# Patient Record
Sex: Female | Born: 1980 | Race: White | Hispanic: Yes | Marital: Married | Smoking: Never smoker
Health system: Southern US, Community
[De-identification: ages and names within clinical notes are randomized; demographics above are authoritative.]

## PROBLEM LIST (undated history)

## (undated) ENCOUNTER — Ambulatory Visit (HOSPITAL_COMMUNITY): Admission: EM | Payer: Self-pay | Source: Home / Self Care

## (undated) ENCOUNTER — Inpatient Hospital Stay (HOSPITAL_COMMUNITY): Payer: Self-pay

## (undated) DIAGNOSIS — Z789 Other specified health status: Secondary | ICD-10-CM

## (undated) HISTORY — PX: NO PAST SURGERIES: SHX2092

---

## 2000-08-04 ENCOUNTER — Ambulatory Visit (HOSPITAL_COMMUNITY): Admission: RE | Admit: 2000-08-04 | Discharge: 2000-08-04 | Payer: Self-pay | Admitting: *Deleted

## 2000-12-24 ENCOUNTER — Observation Stay (HOSPITAL_COMMUNITY): Admission: AD | Admit: 2000-12-24 | Discharge: 2000-12-25 | Payer: Self-pay | Admitting: *Deleted

## 2000-12-29 ENCOUNTER — Inpatient Hospital Stay (HOSPITAL_COMMUNITY): Admission: AD | Admit: 2000-12-29 | Discharge: 2000-12-29 | Payer: Self-pay | Admitting: Obstetrics & Gynecology

## 2001-01-07 ENCOUNTER — Inpatient Hospital Stay (HOSPITAL_COMMUNITY): Admission: AD | Admit: 2001-01-07 | Discharge: 2001-01-09 | Payer: Self-pay | Admitting: *Deleted

## 2002-02-13 ENCOUNTER — Ambulatory Visit (HOSPITAL_COMMUNITY): Admission: RE | Admit: 2002-02-13 | Discharge: 2002-02-13 | Payer: Self-pay | Admitting: *Deleted

## 2002-05-09 ENCOUNTER — Ambulatory Visit (HOSPITAL_COMMUNITY): Admission: RE | Admit: 2002-05-09 | Discharge: 2002-05-09 | Payer: Self-pay | Admitting: *Deleted

## 2002-05-21 ENCOUNTER — Observation Stay (HOSPITAL_COMMUNITY): Admission: AD | Admit: 2002-05-21 | Discharge: 2002-05-21 | Payer: Self-pay | Admitting: *Deleted

## 2002-05-28 ENCOUNTER — Inpatient Hospital Stay (HOSPITAL_COMMUNITY): Admission: AD | Admit: 2002-05-28 | Discharge: 2002-05-28 | Payer: Self-pay | Admitting: Obstetrics and Gynecology

## 2002-05-30 ENCOUNTER — Inpatient Hospital Stay (HOSPITAL_COMMUNITY): Admission: AD | Admit: 2002-05-30 | Discharge: 2002-05-30 | Payer: Self-pay | Admitting: Obstetrics and Gynecology

## 2002-06-01 ENCOUNTER — Inpatient Hospital Stay (HOSPITAL_COMMUNITY): Admission: AD | Admit: 2002-06-01 | Discharge: 2002-06-01 | Payer: Self-pay | Admitting: Family Medicine

## 2002-06-15 ENCOUNTER — Encounter: Payer: Self-pay | Admitting: Family Medicine

## 2002-06-15 ENCOUNTER — Inpatient Hospital Stay (HOSPITAL_COMMUNITY): Admission: AD | Admit: 2002-06-15 | Discharge: 2002-06-15 | Payer: Self-pay | Admitting: Family Medicine

## 2002-06-23 ENCOUNTER — Inpatient Hospital Stay (HOSPITAL_COMMUNITY): Admission: AD | Admit: 2002-06-23 | Discharge: 2002-06-25 | Payer: Self-pay | Admitting: Family Medicine

## 2004-08-19 ENCOUNTER — Emergency Department (HOSPITAL_COMMUNITY): Admission: EM | Admit: 2004-08-19 | Discharge: 2004-08-19 | Payer: Self-pay | Admitting: Emergency Medicine

## 2006-07-10 ENCOUNTER — Emergency Department (HOSPITAL_COMMUNITY): Admission: EM | Admit: 2006-07-10 | Discharge: 2006-07-10 | Payer: Self-pay | Admitting: Emergency Medicine

## 2006-08-11 ENCOUNTER — Emergency Department (HOSPITAL_COMMUNITY): Admission: EM | Admit: 2006-08-11 | Discharge: 2006-08-12 | Payer: Self-pay | Admitting: Emergency Medicine

## 2006-09-09 ENCOUNTER — Ambulatory Visit (HOSPITAL_COMMUNITY): Admission: RE | Admit: 2006-09-09 | Discharge: 2006-09-09 | Payer: Self-pay | Admitting: Family Medicine

## 2007-01-19 ENCOUNTER — Inpatient Hospital Stay (HOSPITAL_COMMUNITY): Admission: AD | Admit: 2007-01-19 | Discharge: 2007-01-20 | Payer: Self-pay | Admitting: Gynecology

## 2007-01-19 ENCOUNTER — Ambulatory Visit: Payer: Self-pay | Admitting: Obstetrics and Gynecology

## 2007-01-26 ENCOUNTER — Inpatient Hospital Stay (HOSPITAL_COMMUNITY): Admission: AD | Admit: 2007-01-26 | Discharge: 2007-01-28 | Payer: Self-pay | Admitting: Obstetrics & Gynecology

## 2007-01-26 ENCOUNTER — Ambulatory Visit: Payer: Self-pay | Admitting: Obstetrics and Gynecology

## 2009-06-15 ENCOUNTER — Emergency Department (HOSPITAL_COMMUNITY): Admission: EM | Admit: 2009-06-15 | Discharge: 2009-06-15 | Payer: Self-pay | Admitting: Internal Medicine

## 2010-05-05 LAB — COMPREHENSIVE METABOLIC PANEL
BUN: 9 mg/dL (ref 6–23)
CO2: 24 mEq/L (ref 19–32)
Chloride: 107 mEq/L (ref 96–112)
Creatinine, Ser: 0.52 mg/dL (ref 0.4–1.2)
GFR calc non Af Amer: >60 mL/min (ref >60)
Glucose, Bld: 107 mg/dL — ABNORMAL HIGH (ref 70–99)
Total Bilirubin: 0.8 mg/dL (ref 0.3–1.2)

## 2010-05-05 LAB — DIFFERENTIAL
Basophils Absolute: 0 10*3/uL (ref 0.0–0.1)
Basophils Relative: 0 % (ref 0–1)
Lymphocytes Relative: 9 % — ABNORMAL LOW (ref 12–46)
Neutro Abs: 6.7 10*3/uL (ref 1.7–7.7)
Neutrophils Relative %: 82 % — ABNORMAL HIGH (ref 43–77)

## 2010-05-05 LAB — URINALYSIS, ROUTINE W REFLEX MICROSCOPIC
Ketones, ur: 15 mg/dL — AB
Leukocytes, UA: NEGATIVE
Nitrite: NEGATIVE
Specific Gravity, Urine: 1.034 — ABNORMAL HIGH (ref 1.005–1.030)
Urobilinogen, UA: 0.2 mg/dL (ref 0.0–1.0)
pH: 6 (ref 5.0–8.0)

## 2010-05-05 LAB — CBC
HCT: 43.2 % (ref 36.0–46.0)
Hemoglobin: 14.9 g/dL (ref 12.0–15.0)
MCV: 87.9 fL (ref 78.0–100.0)
Platelets: 201 10*3/uL (ref 150–400)
RBC: 4.92 MIL/uL (ref 3.87–5.11)
WBC: 8.2 10*3/uL (ref 4.0–10.5)

## 2010-05-05 LAB — URINE MICROSCOPIC-ADD ON

## 2010-11-23 LAB — SYPHILIS: RPR W/REFLEX TO RPR TITER AND TREPONEMAL ANTIBODIES, TRADITIONAL SCREENING AND DIAGNOSIS ALGORITHM: RPR Ser Ql: NONREACTIVE

## 2010-11-23 LAB — CBC
HCT: 31.6 — ABNORMAL LOW
HCT: 35.7 — ABNORMAL LOW
Hemoglobin: 10.5 — ABNORMAL LOW
Hemoglobin: 12.1
MCHC: 33.3
MCV: 84.5
MCV: 85.7
Platelets: 193
Platelets: 230
RBC: 3.69 — ABNORMAL LOW
RDW: 14.3
WBC: 12.2 — ABNORMAL HIGH
WBC: 9.9

## 2010-12-02 LAB — POCT I-STAT CREATININE
Creatinine, Ser: 0.6
Operator id: 284251

## 2010-12-02 LAB — URINALYSIS, ROUTINE W REFLEX MICROSCOPIC
Bilirubin Urine: NEGATIVE
Glucose, UA: NEGATIVE
Ketones, ur: >80 — AB
Nitrite: NEGATIVE
Specific Gravity, Urine: 1.02
pH: 6.5

## 2010-12-02 LAB — I-STAT 8, (EC8 V) (CONVERTED LAB)
Acid-base deficit: 4 — ABNORMAL HIGH
Bicarbonate: 18.6 — ABNORMAL LOW
Glucose, Bld: 86
Potassium: 3.3 — ABNORMAL LOW
TCO2: 19
pCO2, Ven: 26.9 — ABNORMAL LOW
pH, Ven: 7.447 — ABNORMAL HIGH

## 2010-12-02 LAB — DIFFERENTIAL
Basophils Absolute: 0
Basophils Relative: 0
Eosinophils Absolute: 0
Monocytes Relative: 17 — ABNORMAL HIGH
Neutro Abs: 2.5
Neutrophils Relative %: 62

## 2010-12-02 LAB — URINE MICROSCOPIC-ADD ON

## 2010-12-02 LAB — CBC
MCHC: 34.2
MCV: 86.6
Platelets: 190
RBC: 4.06
RDW: 13.3

## 2010-12-02 LAB — HCG, QUANTITATIVE, PREGNANCY: hCG, Beta Chain, Quant, S: 21312 — ABNORMAL HIGH

## 2011-07-09 IMAGING — US US ABDOMEN COMPLETE
1 series · 14 of 25 positions shown · non-contrast
Comparison: None

CLINICAL DATA: Abdominal pain, nausea and vomiting.

COMPLETE ABDOMINAL ULTRASOUND

[Series 1: us abdomen complete · 0.26mm/px · 14 of 67 slices shown]
[im 1/67]
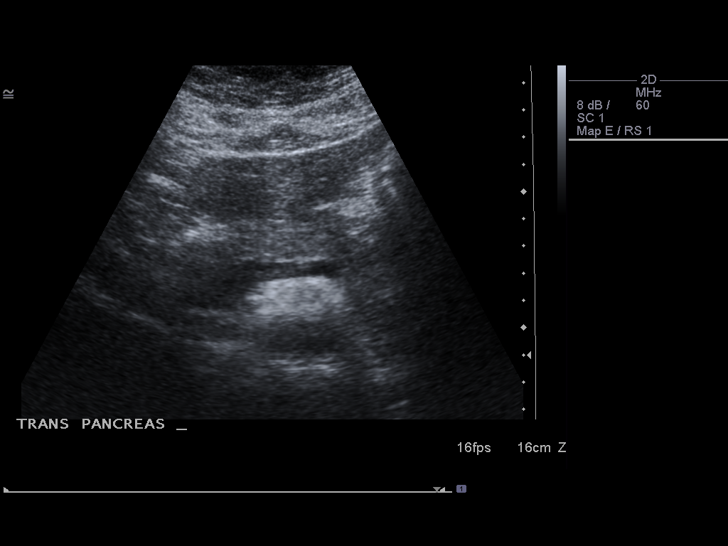
[im 6/67]
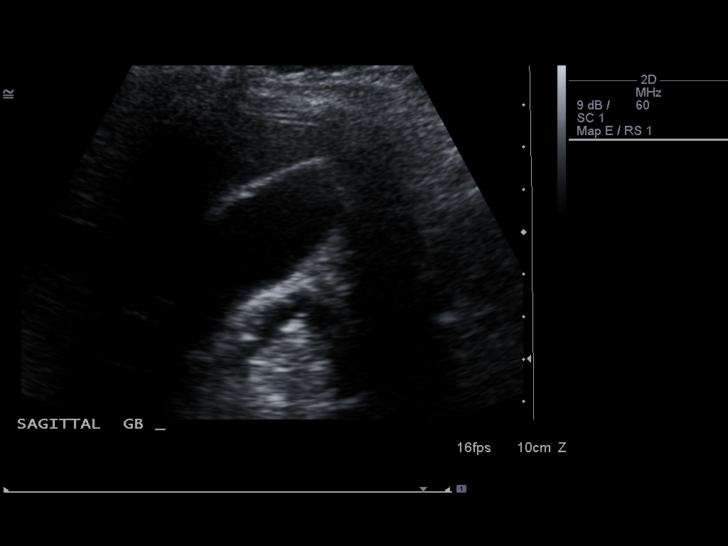
[im 12/67]
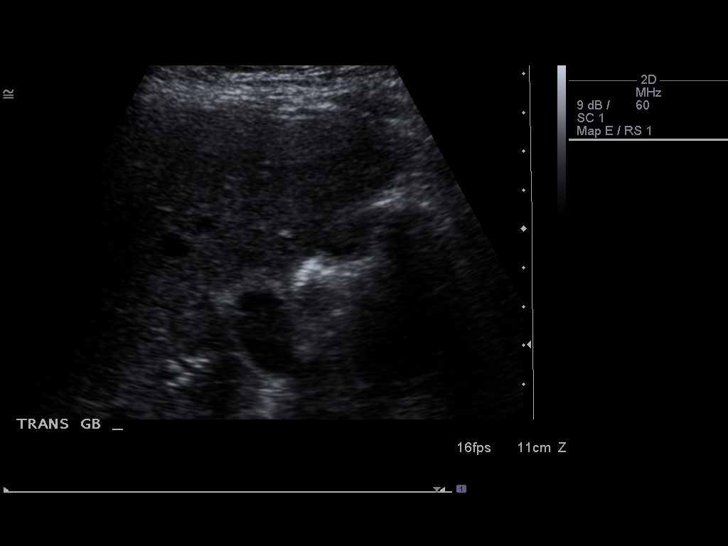
[im 17/67]
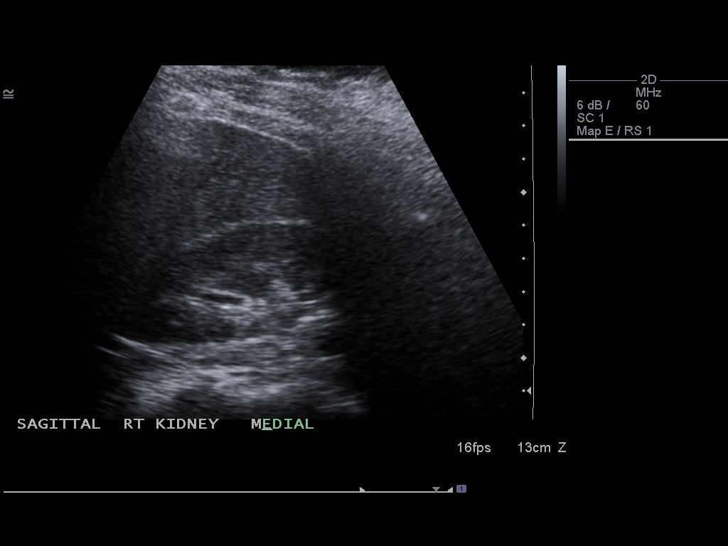
[im 23/67]
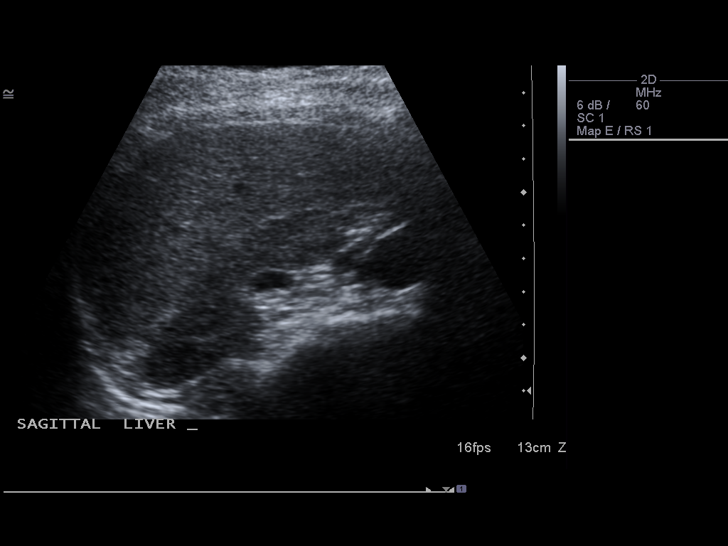
[im 25/67]
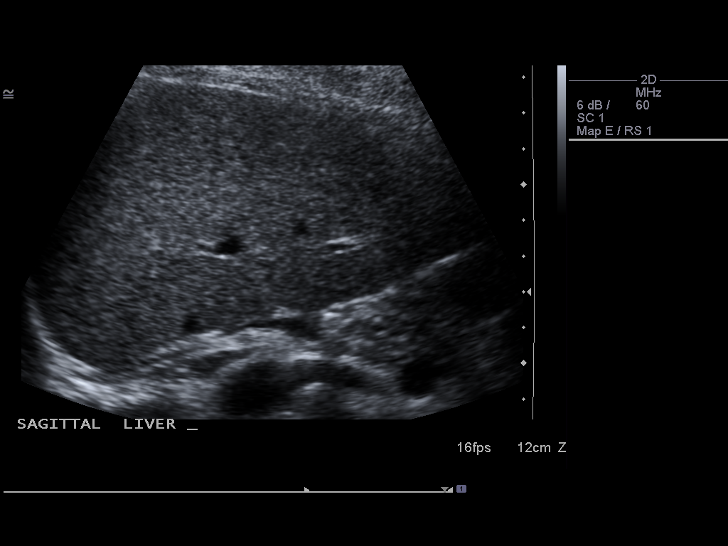
[im 31/67]
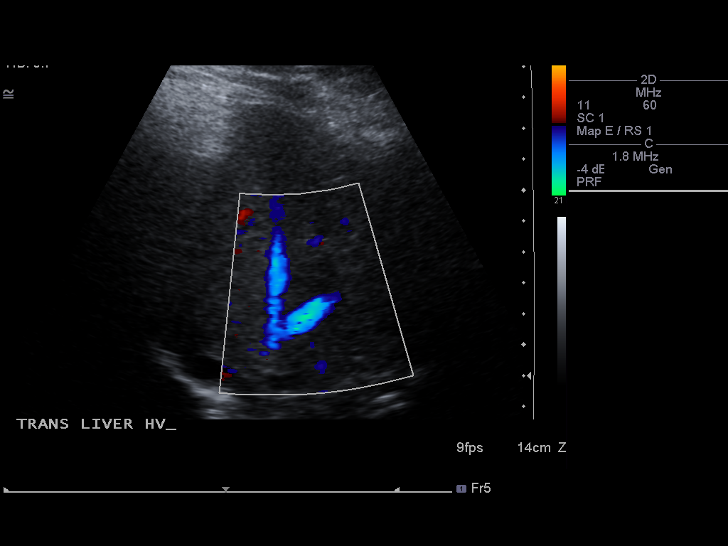
[im 36/67]
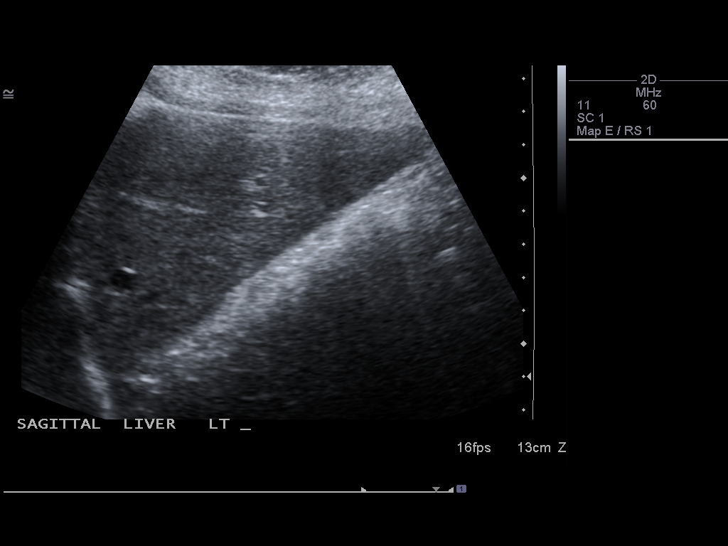
[im 42/67]
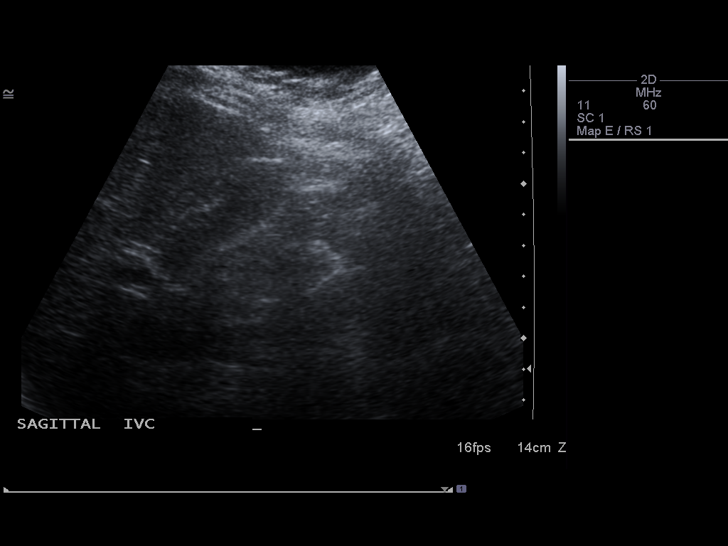
[im 45/67]
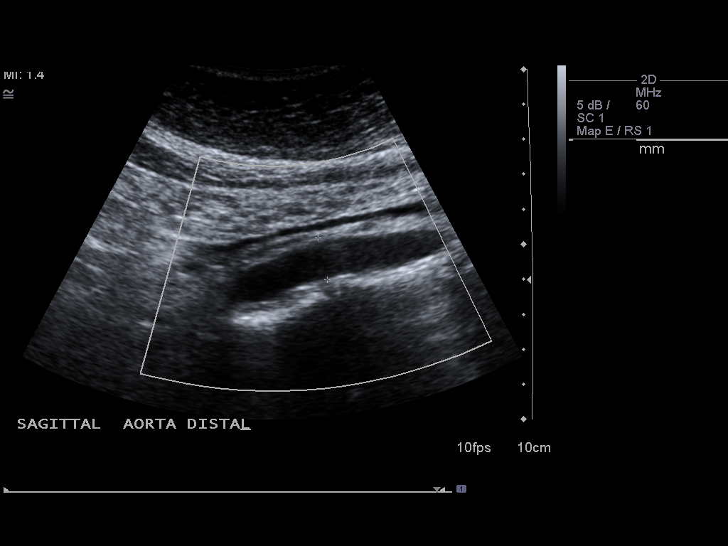
[im 50/67]
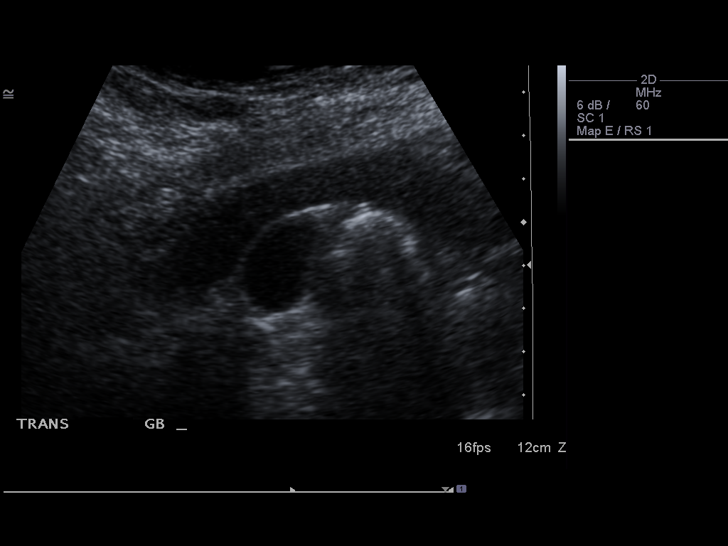
[im 56/67]
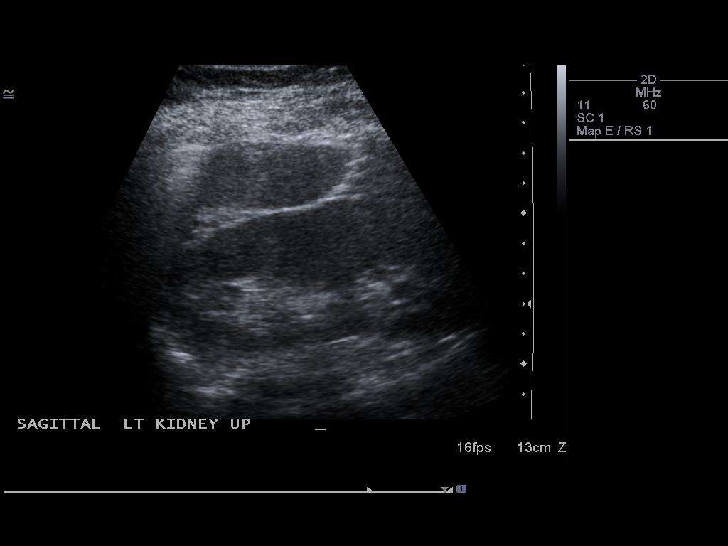
[im 61/67]
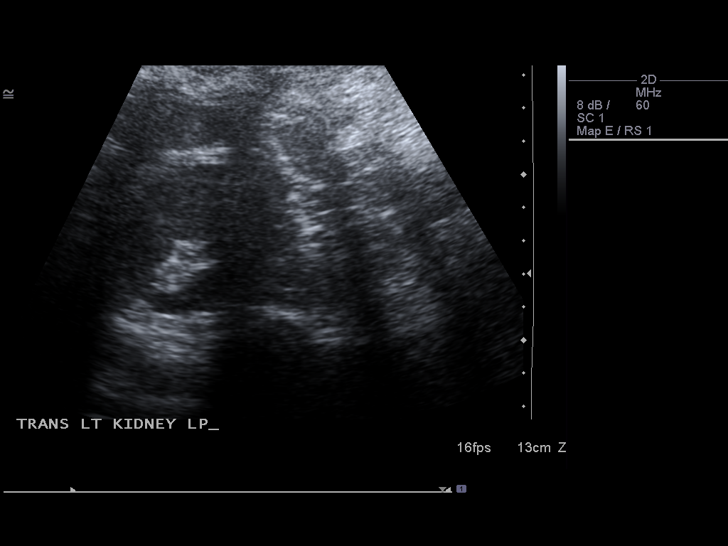
[im 67/67]
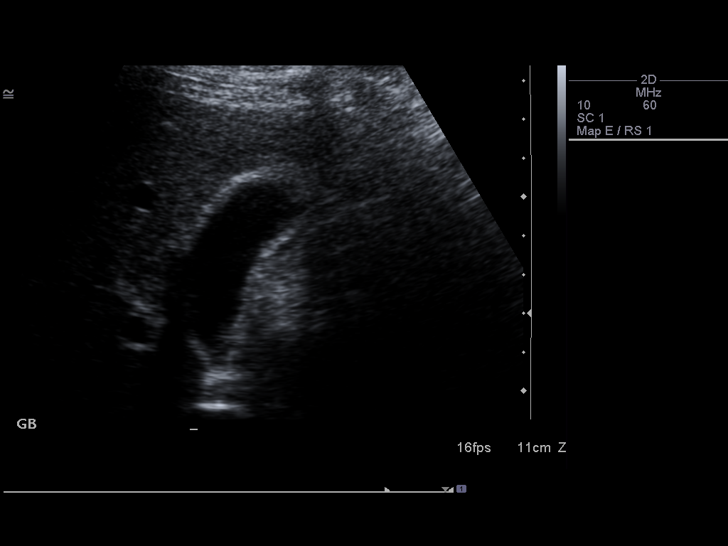

[14 of 25 positions shown; findings below may reference images not displayed]

FINDINGS: Gallbladder:  No gallstones, gallbladder wall thickening, or
pericholecystic fluid.

Common bile duct:  Normal in caliber measuring a maximum of 2.9 mm.

Liver:  No focal lesion identified.  Within normal limits in
parenchymal echogenicity.

IVC:  Normal caliber.

Pancreas:  Sonographically unremarkable.

Spleen:  Normal in size and echogenicity without focal lesions.

Right Kidney:  10.8 cm in length. Normal renal cortical thickness
and echogenicity without focal lesions or hydronephrosis.

Left Kidney:  11.7 cm in length. Normal renal cortical thickness
and echogenicity without focal lesions or hydronephrosis.

Abdominal aorta:  Normal caliber.
IMPRESSION: Unremarkable abdominal ultrasound examination.

## 2012-03-20 LAB — OB RESULTS CONSOLE GC/CHLAMYDIA
CHLAMYDIA, DNA PROBE: NEGATIVE
Gonorrhea: NEGATIVE

## 2012-03-20 LAB — OB RESULTS CONSOLE GBS: GBS: NEGATIVE

## 2012-04-18 ENCOUNTER — Ambulatory Visit: Payer: Self-pay | Admitting: Emergency Medicine

## 2012-04-18 VITALS — BP 108/74 | HR 85 | Temp 98.6°F | Resp 16 | Ht 60.75 in | Wt 160.6 lb

## 2012-04-18 DIAGNOSIS — J018 Other acute sinusitis: Secondary | ICD-10-CM

## 2012-04-18 MED ORDER — PSEUDOEPHEDRINE-GUAIFENESIN ER 60-600 MG PO TB12: 1.0000 | ORAL_TABLET | Freq: Two times a day (BID) | ORAL | Status: DC

## 2012-04-18 MED ORDER — AMOXICILLIN-POT CLAVULANATE 875-125 MG PO TABS: 1.0000 | ORAL_TABLET | Freq: Two times a day (BID) | ORAL | Status: DC

## 2012-04-18 MED ORDER — BUTALBITAL-APAP-CAFFEINE 50-325-40 MG PO TABS: 1.0000 | ORAL_TABLET | Freq: Four times a day (QID) | ORAL | Status: DC | PRN

## 2012-04-18 NOTE — Progress Notes (Signed)
[facility] [address], [place] [address] [phone]  Date:  04/18/2012   Name:  Alisha Hall   DOB:  04-28-1980   MRN:  [id number]  PCP:  No primary Maryuri Warnke on file.    Chief Complaint: Headache, Neck Pain and pressure on the nose   History of Present Illness:  Alisha Hall is a 32 y.o. very pleasant female patient who presents with the following:  Patient has multiple problems.  History of recurrent sinusitis treated in past 2 years.  Now has nasal congestion and post nasal drainage and pain in maxillary and frontal sinuses.  No fever or chills. No cough, wheezing or shortness of breath.  No nausea or vomiting.  Present now for two months.    1 week history of pain in both temples and back of neck into shoulders and occiput.  No neuro or visual symptoms.  No neck injury.  No injury or overuse.  No peripheral neuro or visual symptoms.  No nausea or vomiting. No photophobia.  No improvement with over the counter medications or other home remedies.  Not employed  Denies other complaint or health concern today.   There is no problem list on file for this patient.   No past medical history on file.  No past surgical history on file.  History  Substance Use Topics  . Smoking status: Never Smoker   . Smokeless tobacco: Not on file  . Alcohol Use: Not on file    No family history on file.  No Known Allergies  Medication list has been reviewed and updated.  No current outpatient prescriptions on file prior to visit.   No current facility-administered medications on file prior to visit.    Review of Systems:  As per HPI, otherwise negative.    Physical Examination: Filed Vitals:   04/18/12 1701  BP: 108/74  Pulse: 85  Temp: 98.6 F (37 C)  Resp: 16   Filed Vitals:   04/18/12 1701  Height: 5' 0.75" (1.543 m)  Weight: 160 lb 9.6 oz (72.848 kg)   Body mass index is 30.6 kg/(m^2). Ideal Body Weight:  Weight in (lb) to have BMI = 25: 131  GEN: WDWN, NAD, Non-toxic, A & O x 3 HEENT: Atraumatic, Normocephalic. Neck supple. No masses, No LAD.  Tender maxillary and frontal sinuses Ears and Nose: No external deformity. CV: RRR, No M/G/R. No JVD. No thrill. No extra heart sounds. PULM: CTA B, no wheezes, crackles, rhonchi. No retractions. No resp. distress. No accessory muscle use. ABD: S, NT, ND, +BS. No rebound. No HSM. EXTR: No c/c/e NEURO Normal gait.  PSYCH: Normally interactive. Conversant. Not depressed or anxious appearing.  Calm demeanor.  Neck:  Tender trapezius with marked spasm  Assessment and Plan: Tension headache Sinusitis augmentin mucinex fioricet Follow up as needed  Carmelina Dane, MD

## 2012-04-18 NOTE — Patient Instructions (Addendum)
Sinusitis Sinusitis is redness, soreness, and swelling (inflammation) of the paranasal sinuses. Paranasal sinuses are air pockets within the bones of your face (beneath the eyes, the middle of the forehead, or above the eyes). In healthy paranasal sinuses, mucus is able to drain out, and air is able to circulate through them by way of your nose. However, when your paranasal sinuses are inflamed, mucus and air can become trapped. This can allow bacteria and other germs to grow and cause infection. Sinusitis can develop quickly and last only a short time (acute) or continue over a long period (chronic). Sinusitis that lasts for more than 12 weeks is considered chronic.  CAUSES  Causes of sinusitis include:  Allergies.  Structural abnormalities, such as displacement of the cartilage that separates your nostrils (deviated septum), which can decrease the air flow through your nose and sinuses and affect sinus drainage.  Functional abnormalities, such as when the small hairs (cilia) that line your sinuses and help remove mucus do not work properly or are not present. SYMPTOMS  Symptoms of acute and chronic sinusitis are the same. The primary symptoms are pain and pressure around the affected sinuses. Other symptoms include:  Upper toothache.  Earache.  Headache.  Bad breath.  Decreased sense of smell and taste.  A cough, which worsens when you are lying flat.  Fatigue.  Fever.  Thick drainage from your nose, which often is [place] and may contain pus (purulent).  Swelling and warmth over the affected sinuses. DIAGNOSIS  Your caregiver will perform a physical exam. During the exam, your caregiver may:  Look in your nose for signs of abnormal growths in your nostrils (nasal polyps).  Tap over the affected sinus to check for signs of infection.  View the inside of your sinuses (endoscopy) with a special imaging device with a light attached (endoscope), which is inserted into your  sinuses. If your caregiver suspects that you have chronic sinusitis, one or more of the following tests may be recommended:  Allergy tests.  Nasal culture A sample of mucus is taken from your nose and sent to a lab and screened for bacteria.  Nasal cytology A sample of mucus is taken from your nose and examined by your caregiver to determine if your sinusitis is related to an allergy. TREATMENT  Most cases of acute sinusitis are related to a viral infection and will resolve on their own within 10 days. Sometimes medicines are prescribed to help relieve symptoms (pain medicine, decongestants, nasal steroid sprays, or saline sprays).  However, for sinusitis related to a bacterial infection, your caregiver will prescribe antibiotic medicines. These are medicines that will help kill the bacteria causing the infection.  Rarely, sinusitis is caused by a fungal infection. In theses cases, your caregiver will prescribe antifungal medicine. For some cases of chronic sinusitis, surgery is needed. Generally, these are cases in which sinusitis recurs more than 3 times per year, despite other treatments. HOME CARE INSTRUCTIONS   Drink plenty of water. Water helps thin the mucus so your sinuses can drain more easily.  Use a humidifier.  Inhale steam 3 to 4 times a day (for example, sit in the bathroom with the shower running).  Apply a warm, moist washcloth to your face 3 to 4 times a day, or as directed by your caregiver.  Use saline nasal sprays to help moisten and clean your sinuses.  Take over-the-counter or prescription medicines for pain, discomfort, or fever only as directed by your caregiver. SEEK IMMEDIATE MEDICAL   CARE IF:  You have increasing pain or severe headaches.  You have nausea, vomiting, or drowsiness.  You have swelling around your face.  You have vision problems.  You have a stiff neck.  You have difficulty breathing. MAKE SURE YOU:   Understand these  instructions.  Will watch your condition.  Will get help right away if you are not doing well or get worse. Document Released: 02/01/2005 Document Revised: 04/26/2011 Document Reviewed: 02/16/2011 [facility] Patient Information 2013 [place], [place]. Cefalea tensional  (Tension Headache)  Una cefalea tensional es una sensacin de dolor y opresin en la frente y los lados de la cabeza. El dolor puede ser sordo o puede sentirse que comprime (constrictivo). Este es el tipo ms comn de dolor de [place]. Generalmente no se asocian con nuseas o vmitos y no empeoran con la actividad fsica. Pueden durar desde 30 minutos a varios das.  CAUSAS  Se desconoce la causa exacta, pero puede ser causada por las sustancias qumicas y las hormonas del cerebro que producen dolor. Suelen comenzar despus de una situacin de estrs, ansiedad o por depresin. Otros desencadenantes pueden ser:   El alcohol.  Cafena (demasiada o abstinencia).  Infecciones respiratorias (resfriados, gripes, sinusitis).  Problemas dentales o apretar los dientes.  Fatiga.  Mantener la cabeza y el cuello en una posicin demasiado tiempo mientras utiliza un ordenador. SNTOMAS   Presin alrededor de la cabeza.   Dolor "sordo" en la cabeza.   Dolor que siente sobre la frente y los lados de la cabeza.   Sensibilidad en los msculos de la cabeza, del cuello y de los hombros. DIAGNSTICO  El diagnstico se realiza en base a:   Sntomas.   Examen fsico.   Neomia Dear TC (tomografa computada) o resonancia magntica de la cabeza. Se pueden pedir estas pruebas, si los sntomas son severos o inusuales. TRATAMIENTO  Le recetarn medicamentos que ayudan a [employer].  INSTRUCCIONES PARA EL CUIDADO EN EL HOGAR   Slo tome medicamentos de venta libre o recetados para Primary school teacher o Environmental health practitioner, segn las indicaciones de su mdico.   Cuando sienta dolor de cabeza acustese en un cuarto oscuro y tranquilo.    Lleve un registro diario para Financial risk analyst lo que [employer] dolores de [place]. Por ejemplo, escriba:  Lo que come y bebe.  Cunto tiempo duerme.  Todo cambio en la dieta o medicamentos.  Trate con masajes u otras tcnicas de relajacin.   Pueden utilizarse bolsas de hielo o calor aplicadas a la cabeza o al cuello. selos 3 a 4 veces por da de 15 a 20 minutos por vez, o como sea necesario.   Limite las situaciones de estrs.   Sintese con la espalda recta y no tense los msculos.   Si fuma, deje de hacerlo.  Limite el consumo de bebidas alcohlicas.  Consuma menos cantidad de cafena o deje de tomarla.  Coma y haga ejercicios regularmente.  Duerma entre 7 y 9 horas o como lo indique su mdico.  Evite el uso excesivo de medicamentos para Investment banker, operational recurrente de cabeza que pueden ocurrir.  SOLICITE ATENCIN MDICA SI:   Tiene problemas con los Arboriculturist.  El medicamento no le hace efecto.  El dolor de cabeza que senta habitualmente es diferente.  Tiene nuseas o vmitos. SOLICITE ATENCIN MDICA DE INMEDIATO SI:   El dolor se hace cada vez ms intenso.  Tiene fiebre.  Presenta rigidez en el cuello.  Sufre prdida  de la visin.  Presenta debilidad muscular o prdida del control muscular.  Pierde equilibrio o tiene problemas para Advertising account planner.  Sufre mareos o se desmaya.  Tiene sntomas graves que son diferentes a los primeros sntomas. ASEGRESE DE QUE:   Comprende estas instrucciones.  Controlar su enfermedad.  Solicitar ayuda de inmediato si no mejora o si empeora. Document Released: 11/11/2004 Document Revised: 04/26/2011 [facility] Patient Information 2013 [place], [place].

## 2012-06-20 ENCOUNTER — Ambulatory Visit: Payer: Self-pay | Admitting: Physician Assistant

## 2012-06-20 VITALS — BP 122/84 | HR 96 | Temp 98.6°F | Resp 18 | Ht 61.0 in | Wt 157.8 lb

## 2012-06-20 DIAGNOSIS — J302 Other seasonal allergic rhinitis: Secondary | ICD-10-CM

## 2012-06-20 DIAGNOSIS — J309 Allergic rhinitis, unspecified: Secondary | ICD-10-CM

## 2012-06-20 DIAGNOSIS — J019 Acute sinusitis, unspecified: Secondary | ICD-10-CM

## 2012-06-20 DIAGNOSIS — J329 Chronic sinusitis, unspecified: Secondary | ICD-10-CM

## 2012-06-20 MED ORDER — GUAIFENESIN ER 1200 MG PO TB12: 1.0000 | ORAL_TABLET | Freq: Two times a day (BID) | ORAL | Status: AC

## 2012-06-20 MED ORDER — FLUTICASONE PROPIONATE 50 MCG/ACT NA SUSP: 2.0000 | Freq: Every day | NASAL | Status: DC

## 2012-06-20 MED ORDER — PREDNISONE 10 MG PO TABS: ORAL_TABLET | ORAL | Status: DC

## 2012-06-20 MED ORDER — AMOXICILLIN 875 MG PO TABS: 1750.0000 mg | ORAL_TABLET | Freq: Two times a day (BID) | ORAL | Status: DC

## 2012-06-20 NOTE — Progress Notes (Signed)
   [address] [place] [address]   Phone [phone]  Subjective:    Patient ID: Alisha Hall, female    DOB: 06-07-1980, 32 y.o.   MRN: [id number]  HPI Pt presents to clinic with 1 wk h/o terrible sinus congestion.  She has h/o seasonal allergies - she takes daily Zyrtec but does not seem to help.  She has headaches and pain in her face mainly on the R side.     Review of Systems  Constitutional: Negative for fever and chills.  HENT: Positive for congestion, sore throat, rhinorrhea (clear) and sneezing.   Allergic/Immunologic: Positive for environmental allergies (gets this every spring -).       Objective:   Physical Exam  Vitals reviewed. Constitutional: She is oriented to person, place, and time. She appears well-developed and well-nourished.  HENT:  Head: Normocephalic and atraumatic.  Right Ear: Hearing, tympanic membrane, external ear and ear canal normal.  Left Ear: Hearing, tympanic membrane, external ear and ear canal normal.  Nose: Mucosal edema (pale and completed swollen closed both sides) present.  Mouth/Throat: Uvula is midline, oropharynx is clear and moist and mucous membranes are normal.  Eyes: Conjunctivae are normal.  Cardiovascular: Normal rate, regular rhythm and normal heart sounds.   No murmur heard. Pulmonary/Chest: Effort normal and breath sounds normal.  Neurological: She is alert and oriented to person, place, and time.  Skin: Skin is warm and dry.  Psychiatric: She has a normal mood and affect. Her behavior is normal. Judgment and thought content normal.      Assessment & Plan:   Acute sinusitis - Plan: Guaifenesin (MUCINEX MAXIMUM STRENGTH) 1200 MG TB12, amoxicillin (AMOXIL) 875 MG tablet, predniSONE (DELTASONE) 10 MG tablet -- this is from her under treated allergies -- due to significant nasal turbinate swelling will try prednisone --   Seasonal allergies - Plan: predniSONE (DELTASONE) 10 MG tablet, fluticasone (FLONASE) 50  MCG/ACT nasal spray  Benny Lennert PA-C 06/20/2012 8:03 PM

## 2012-10-14 ENCOUNTER — Ambulatory Visit: Payer: Self-pay | Admitting: Family Medicine

## 2012-10-14 VITALS — BP 102/64 | HR 65 | Temp 98.4°F | Resp 16 | Ht 61.0 in | Wt 154.0 lb

## 2012-10-14 DIAGNOSIS — R197 Diarrhea, unspecified: Secondary | ICD-10-CM

## 2012-10-14 DIAGNOSIS — Z331 Pregnant state, incidental: Secondary | ICD-10-CM

## 2012-10-14 DIAGNOSIS — Z3490 Encounter for supervision of normal pregnancy, unspecified, unspecified trimester: Secondary | ICD-10-CM

## 2012-10-14 DIAGNOSIS — R1032 Left lower quadrant pain: Secondary | ICD-10-CM

## 2012-10-14 LAB — POCT CBC
Granulocyte percent: 68.1 %G (ref 37–80)
HCT, POC: 40.7 % (ref 37.7–47.9)
Hemoglobin: 13.2 g/dL (ref 12.2–16.2)
Lymph, poc: 1.8 (ref 0.6–3.4)
MCV: 91.2 fL (ref 80–97)
POC Granulocyte: 4.8 (ref 2–6.9)
POC LYMPH PERCENT: 25.5 %L (ref 10–50)

## 2012-10-14 LAB — COMPREHENSIVE METABOLIC PANEL
ALT: 8 U/L (ref 0–35)
AST: 16 U/L (ref 0–37)
Albumin: 3.8 g/dL (ref 3.5–5.2)
Alkaline Phosphatase: 33 U/L — ABNORMAL LOW (ref 39–117)
Potassium: 4 mEq/L (ref 3.5–5.3)
Sodium: 138 mEq/L (ref 135–145)
Total Bilirubin: 0.4 mg/dL (ref 0.3–1.2)
Total Protein: 6.6 g/dL (ref 6.0–8.3)

## 2012-10-14 MED ORDER — METRONIDAZOLE 500 MG PO TABS: 500.0000 mg | ORAL_TABLET | Freq: Three times a day (TID) | ORAL | Status: DC

## 2012-10-14 NOTE — Progress Notes (Signed)
[address]   [place], [place]  [address]   [phone]  Subjective:    Patient ID: Alisha Hall, female    DOB: 04/14/1980, 32 y.o.   MRN: [id number]  HPI This 32 y.o. female presents for evaluation of [redacted] weeks pregnant presenting with diarrhea, LLQ pain.  Onset one week ago.  No fever/chills/sweats.  Diarrhea x 10+ stools per day; non-bloody; non-mucous. Stool volumes medium to small.  No nausea or vomiting.   No recent travel.  Took antibiotics with diagnosis with pregnancy due to "infection on ovaries".  Antibiotics one month ago.  No name of antibiotic.  Has not called OB; has not had initial appointment with OB; appointment 10/31/12.  Appointment with Clinic at [facility] Department.  +menstrual cramps lately mild; no vaginal bleeding, no leaking from vagina but thicker discharge from baseline.    Review of Systems  Constitutional: Negative for fever, chills, diaphoresis and fatigue.  Gastrointestinal: Positive for abdominal pain and diarrhea. Negative for nausea, vomiting, constipation, blood in stool and abdominal distention.    History reviewed. No pertinent past medical history.  History reviewed. No pertinent past surgical history.  Prior to Admission medications   Medication Sig Start Date End Date Taking? Authorizing Provider  Prenatal Vit-Fe Fumarate-FA (MULTIVITAMIN-PRENATAL) 27-0.8 MG TABS tablet Take 1 tablet by mouth daily at 12 noon.   Yes Historical Provider, MD  amoxicillin (AMOXIL) 875 MG tablet Take 2 tablets (1,750 mg total) by mouth 2 (two) times daily. 06/20/12   Morrell Riddle, PA-C  butalbital-acetaminophen-caffeine (FIORICET) [phone] MG per tablet Take 1-2 tablets by mouth every 6 (six) hours as needed for headache. 04/18/12 04/18/13  Phillips Odor, MD  cetirizine (ZYRTEC) 10 MG tablet Take 10 mg by mouth daily.    Historical Provider, MD  fluticasone (FLONASE) 50 MCG/ACT nasal spray Place 2 sprays into the nose daily. 06/20/12   Morrell Riddle, PA-C    predniSONE (DELTASONE) 10 MG tablet 6-5-4-3-2-1 - take all the pills for the day all together in the am with food. 06/20/12   Morrell Riddle, PA-C    No Known Allergies  History   Social History  . Marital Status: Married    Spouse Name: N/A    Number of Children: N/A  . Years of Education: N/A   Occupational History  . Not on file.   Social History Main Topics  . Smoking status: Never Smoker   . Smokeless tobacco: Not on file  . Alcohol Use: Not on file  . Drug Use: Not on file  . Sexual Activity: Not on file   Other Topics Concern  . Not on file   Social History Narrative  . No narrative on file    Family History  Problem Relation Age of Onset  . Cancer Maternal Grandfather        Objective:   Physical Exam  Nursing note and vitals reviewed. Constitutional: She appears well-developed and well-nourished. No distress.  HENT:  Mouth/Throat: Oropharynx is clear and moist.  Eyes: Conjunctivae are normal. Pupils are equal, round, and reactive to light.  Neck: Normal range of motion. Neck supple. No thyromegaly present.  Cardiovascular: Normal rate, regular rhythm and normal heart sounds.   No murmur heard. Pulmonary/Chest: Effort normal and breath sounds normal. She has no wheezes. She has no rales.  Abdominal: Soft. Bowel sounds are normal. She exhibits no distension and no mass. There is tenderness in the left lower quadrant. There is no rebound and no guarding.  Lymphadenopathy:    She has no cervical adenopathy.  Skin: Skin is warm and dry. She is not diaphoretic.  Psychiatric: She has a normal mood and affect. Her behavior is normal.   Results for orders placed in visit on 10/14/12  POCT CBC      Result Value Range   WBC 7.1  4.6 - 10.2 K/uL   Lymph, poc 1.8  0.6 - 3.4   POC LYMPH PERCENT 25.5  10 - 50 %L   MID (cbc) 0.5  0 - 0.9   POC MID % 6.4  0 - 12 %M   POC Granulocyte 4.8  2 - 6.9   Granulocyte percent 68.1  37 - 80 %G   RBC 4.46  4.04 - 5.48 M/uL    Hemoglobin 13.2  12.2 - 16.2 g/dL   HCT, POC 16.1  09.6 - 47.9 %   MCV 91.2  80 - 97 fL   MCH, POC 29.6  27 - 31.2 pg   MCHC 32.4  31.8 - 35.4 g/dL   RDW, POC 04.5     Platelet Count, POC 218  142 - 424 K/uL   MPV 8.8  0 - 99.8 fL       Assessment & Plan:  Diarrhea - Plan: POCT CBC, Clostridium difficile EIA, Culture, Group A Strep, Stool culture  Abdominal pain, LLQ  Pregnancy   1.  Diarrhea:  New.  Obtain stool cultures, C.Diff, O&V.  Treat empirically with Flagyl.  Encourage BRAT diet, hydrate.  RTC in 3-4 days if not significantly improved. 2.  Abdominal pain LLQ:  New. Associated with diarrhea; benign abdominal exam.  RTC for acute worsening. 3.  Pregnancy: IUP at 12 weeks; has appointment with OB in upcoming month.    Meds ordered this encounter  Medications  . Prenatal Vit-Fe Fumarate-FA (MULTIVITAMIN-PRENATAL) 27-0.8 MG TABS tablet    Sig: Take 1 tablet by mouth daily at 12 noon.  . metroNIDAZOLE (FLAGYL) 500 MG tablet    Sig: Take 1 tablet (500 mg total) by mouth 3 (three) times daily.    Dispense:  30 tablet    Refill:  0

## 2012-10-14 NOTE — Patient Instructions (Addendum)
Diarrea   (Diarrhea)  La diarrea consiste en evacuaciones intestinales frecuentes, blandas o acuosas. Puede hacerlo sentir débil y deshidratado. La deshidratación puede hacer que se sienta cansado, sediento, tener la boca seca y que haya disminución de orina, que a menudo es de color amarillo oscuro. La diarrea es un signo de otro problema, generalmente una infección que no durará mucho tiempo. En la mayoría de los casos, la diarrea dura típicamente 2 a 3 días. Sin embargo, puede durar más tiempo si se trata de un signo de algo más serio. Es importante tratar la diarrea como lo indique su médico para disminuir o prevenir futuros episodios de diarrea.   CAUSAS   Algunas causas comunes son:   · Infecciones gastrointestinales causadas por virus, bacterias o parásitos.  · Intoxicación alimentaria o alergias a los alimentos.  · Ciertos medicamentos, como los antibióticos, quimioterapia y laxantes.  · Edulcorantes artificiales y fructosa.  · Los trastornos digestivos.  INSTRUCCIONES PARA EL CUIDADO EN EL HOGAR   · Asegure una adecuada ingesta de líquidos (hidratación). Evite los líquidos que contengan azúcares simples o las bebidas deportivas, los jugos de frutas, los productos derivados de la leche entera y las gaseosas. Si bebe la cantidad suficiente de líquidos, la orina debe ser clara o amarillo pálido. Una solución de rehidratación oral se puede comprar en las farmacias, en las tiendas minoristas y por Internet. Se puede preparar una solución de rehidratación oral casera con los siguientes ingredientes:  ·    cucharadita de sal.  · ¾ cucharadita de bicarbonato.  ·  de cucharadita de sal sustituta que contenga cloruro de potasio.  · 1  cucharada de azúcar.  · 1l (34 onzas) de agua.  · Ciertos alimentos y bebidas pueden aumentar la velocidad a la que el alimento se mueve a través del tracto gastrointestinal (GI). Estos alimentos y bebidas deben evitarse e incluyen:  · Bebidas alcohólicas y con cafeína.  · Alimentos  ricos en fibra, como frutas y verduras, nueces, semillas, panes y cereales integrales.  · Alimentos y bebidas endulzados con alcoholes de azúcar, tales como xilitol, sorbitol, y manitol.  · Algunos alimentos pueden ser bien tolerados y puede ayudar a espesar las heces, incluyendo:  · Alimentos con almidón, como arroz, pan, pasta, cereales bajos en azúcar, avena, sémola de maíz, papas al horno, galletas y panecillos.  · Bananas.  · Puré de manzana.  · Agregue alimentos ricos en probióticos a la dieta del niño para ayudar a aumentar las bacterias saludables en el tracto gastrointestinal, como el yogur y productos lácteos fermentados.  · Lávese bien las manos después de cada episodio de diarrea.  · Tome sólo medicamentos de venta libre o recetados, según las indicaciones del médico.  · Tome un baño caliente para ayudar a disminuir ardor o dolor por los episodios frecuentes de diarrea.  SOLICITE ATENCIÓN MÉDICA DE INMEDIATO SI:   · No puede retener los líquidos.  · Tiene vómitos persistentes.  · Observa sangre en la materia fecal, o las heces son negras y de aspecto alquitranado.  · No hay emisión de orina durante 6 a 8 horas o elimina una pequeña cantidad de orina muy oscura.  · Tiene dolor abdominal que aumenta o se localiza.  · Está muy mareado o se desvanece.  · Sufre un dolor intenso de cabeza.  · La diarrea empeora o no mejora.  · Tiene fiebre o síntomas que persisten durante más de 2 o 3 días.  · Tiene fiebre y los síntomas   empeoran de manera súbita.  ASEGÚRESE DE QUE:   · Comprende estas instrucciones.  · Controlará su enfermedad.  · Solicitará ayuda de inmediato si no mejora o si empeora.  Document Released: 02/01/2005 Document Revised: 01/19/2012  [employer]® Patient Information ©2014 [employer].

## 2012-10-15 LAB — CLOSTRIDIUM DIFFICILE EIA: CDIFTX: NEGATIVE

## 2012-10-17 LAB — OVA AND PARASITE SCREEN: OP: NONE SEEN

## 2012-10-19 ENCOUNTER — Telehealth: Payer: Self-pay | Admitting: Radiology

## 2012-10-19 MED ORDER — AMOXICILLIN 500 MG PO CAPS: 500.0000 mg | ORAL_CAPSULE | Freq: Three times a day (TID) | ORAL | Status: DC

## 2012-10-19 NOTE — Telephone Encounter (Signed)
Spanish speaking only--- please call, stool culture + for Salmonella. Is she still having diarrhea?  If yes, how many stools per day?  Recommend a different antibiotic.  I have sent in Amoxicillin to her pharmacy.

## 2012-10-19 NOTE — Telephone Encounter (Signed)
Patient has stool culture positive for salmonella , lab called.

## 2012-10-20 NOTE — Telephone Encounter (Signed)
Spoke to health department. Alisha Hall,(who had been sent the positive culture report) she is going to contact patient regarding this, she will have patient contacted by someone who speaks Spanish, and make sure patient knows to return to clinic if worse, or not better. She will make sure patient gets the antibiotic as well. To you FYI

## 2012-10-23 ENCOUNTER — Telehealth: Payer: Self-pay | Admitting: Radiology

## 2012-10-23 NOTE — Telephone Encounter (Signed)
Patient pregnant. She is at pharmacy to get her Amox, and is asking if it is safe to take during pregnancy. I have asked pharmacist to see if patients diarrhea continues or if it has improved. Patient has not responded to my calls, I do not speak spanish, the pharmacist will ask and find out. Patients diarrhea has resolved but her abdominal pain continues. Pharmacist has advised patient to take the Amoxicillin. To you FYI

## 2012-10-23 NOTE — Telephone Encounter (Signed)
Yes; pt to take Amoxicillin which is safe in pregnancy.

## 2012-10-25 ENCOUNTER — Encounter (HOSPITAL_COMMUNITY): Payer: Self-pay

## 2012-10-25 ENCOUNTER — Telehealth: Payer: Self-pay

## 2012-10-25 ENCOUNTER — Emergency Department (HOSPITAL_COMMUNITY)
Admission: EM | Admit: 2012-10-25 | Discharge: 2012-10-26 | Disposition: A | Discharge status: Home / Self Care | Payer: Self-pay | Attending: Emergency Medicine | Admitting: Emergency Medicine

## 2012-10-25 DIAGNOSIS — Z792 Long term (current) use of antibiotics: Secondary | ICD-10-CM | POA: Insufficient documentation

## 2012-10-25 DIAGNOSIS — A020 Salmonella enteritis: Secondary | ICD-10-CM | POA: Insufficient documentation

## 2012-10-25 DIAGNOSIS — Z3490 Encounter for supervision of normal pregnancy, unspecified, unspecified trimester: Secondary | ICD-10-CM

## 2012-10-25 DIAGNOSIS — O9989 Other specified diseases and conditions complicating pregnancy, childbirth and the puerperium: Secondary | ICD-10-CM | POA: Insufficient documentation

## 2012-10-25 DIAGNOSIS — R1032 Left lower quadrant pain: Secondary | ICD-10-CM | POA: Insufficient documentation

## 2012-10-25 LAB — CBC WITH DIFFERENTIAL/PLATELET
Eosinophils Absolute: 0.1 10*3/uL (ref 0.0–0.7)
Eosinophils Relative: 1 % (ref 0–5)
Hemoglobin: 13.4 g/dL (ref 12.0–15.0)
Lymphocytes Relative: 24 % (ref 12–46)
Lymphs Abs: 2.5 10*3/uL (ref 0.7–4.0)
MCH: 30.1 pg (ref 26.0–34.0)
MCV: 85.2 fL (ref 78.0–100.0)
Monocytes Relative: 6 % (ref 3–12)
RBC: 4.45 MIL/uL (ref 3.87–5.11)
WBC: 10.2 10*3/uL (ref 4.0–10.5)

## 2012-10-25 LAB — COMPREHENSIVE METABOLIC PANEL
ALT: 5 U/L (ref 0–35)
Alkaline Phosphatase: 33 U/L — ABNORMAL LOW (ref 39–117)
BUN: 8 mg/dL (ref 6–23)
CO2: 23 mEq/L (ref 19–32)
Calcium: 9 mg/dL (ref 8.4–10.5)
GFR calc Af Amer: >90 mL/min (ref >90)
GFR calc non Af Amer: >90 mL/min (ref >90)
Glucose, Bld: 108 mg/dL — ABNORMAL HIGH (ref 70–99)
Potassium: 4 mEq/L (ref 3.5–5.1)
Total Protein: 6.9 g/dL (ref 6.0–8.3)

## 2012-10-25 LAB — URINALYSIS, ROUTINE W REFLEX MICROSCOPIC
Bilirubin Urine: NEGATIVE
Ketones, ur: 15 mg/dL — AB
Leukocytes, UA: NEGATIVE
Nitrite: NEGATIVE
Urobilinogen, UA: 1 mg/dL (ref 0.0–1.0)
pH: 5.5 (ref 5.0–8.0)

## 2012-10-25 MED ORDER — SODIUM CHLORIDE 0.9 % IV SOLN: 1000.0000 mL | INTRAVENOUS | Status: DC

## 2012-10-25 MED ORDER — SODIUM CHLORIDE 0.9 % IV SOLN: 1000.0000 mL | Freq: Once | INTRAVENOUS | Status: DC

## 2012-10-25 MED ORDER — MORPHINE SULFATE 4 MG/ML IJ SOLN: 4.0000 mg | Freq: Once | INTRAMUSCULAR | Status: DC

## 2012-10-25 NOTE — ED Notes (Signed)
Pt c/o RLQ pain and nausea x1 week, seen at her PCP last week for the same, they dx her with simonelli . Pt denies V/D, vaginal bleeding or vaginal discharge. Pt does report burning with urination at times. Pt states she is 13-[redacted] weeks pregnant

## 2012-10-25 NOTE — Telephone Encounter (Signed)
Patients husband calling to ask question about the medication that his wife is taking please call roberto at [phone]

## 2012-10-25 NOTE — Telephone Encounter (Signed)
She has been given Amox for salmonella, patient not improving. Has abdominal pain. Advised to bring her back in since she is painful.

## 2012-10-25 NOTE — ED Provider Notes (Signed)
CSN: [id number]     Arrival date & time 10/25/12  2033 History   First MD Initiated Contact with Patient 10/25/12 2225     Chief Complaint  Patient presents with  . Abdominal Pain   (Consider location/radiation/quality/duration/timing/severity/associated sxs/prior Treatment) HPI  Laureen Makaley Storts is a 32 y.o. female complaining of 13-[redacted] week pregnant 32 yo female presents with LLQ abdominal pain that radiates to the back x 12 days. She cannot speak [place], and her daughter served as Nurse, learning disability. She was seen at family medicine (Dr. Nilda Simmer) on 10/15/12 for LLQ pain and diarrhea without blood or mucous x 6 days. She was prescribed flagyl for possible colitis. She was switched to amoxicillin after stool culture came back positive for salmonella. She has been taking Amoxicillin 500 mg tid for 5 days. She admits that her diarrhea has resolved, but the LLQ pain has worsened. She experiences it constantly, but it is worse after eating. She denies N/V, fever, urinary symptoms and current vaginal bleeding. She was determined to be [redacted] weeks pregnant approximately 5 weeks ago when she had some vaginal bleeding and lower abdominal pain; at this time she was diagnosed with "infection of ovaries" for which she received antibiotic. She has not had an [place] or seen OB, with first appointment scheduled for 10/31/12. She denies any abdominal surgeries.  LMP in June  History reviewed. No pertinent past medical history. History reviewed. No pertinent past surgical history. Family History  Problem Relation Age of Onset  . Cancer Maternal Grandfather    History  Substance Use Topics  . Smoking status: Never Smoker   . Smokeless tobacco: Not on file  . Alcohol Use: No   OB History   Grav Para Term Preterm Abortions TAB SAB Ect Mult Living   1              Review of Systems 10 systems reviewed and found to be negative, except as noted in the HPI  Allergies  Review of patient's allergies  indicates no known allergies.  Home Medications   Current Outpatient Rx  Name  Route  Sig  Dispense  Refill  . amoxicillin (AMOXIL) 500 MG capsule   Oral   Take 500 mg by mouth 3 (three) times daily. Started medication 10-23-12         . metroNIDAZOLE (FLAGYL) 500 MG tablet   Oral   Take 500 mg by mouth 3 (three) times daily. Started medication a week ago. Patient not sure of which day.          BP 101/65  Pulse 78  Temp(Src) 98 F (36.7 C) (Oral)  Resp 20  Wt 154 lb (69.854 kg)  BMI 29.11 kg/m2  SpO2 100%  LMP 06/12/2012 Physical Exam  Nursing note and vitals reviewed. Constitutional: She is oriented to person, place, and time. She appears well-developed and well-nourished. No distress.  HENT:  Head: Normocephalic.  Mouth/Throat: Oropharynx is clear and moist.  Eyes: Conjunctivae and EOM are normal. Pupils are equal, round, and reactive to light.  Neck: Normal range of motion.  Cardiovascular: Normal rate, regular rhythm and intact distal pulses.   Pulmonary/Chest: Effort normal and breath sounds normal. No stridor. No respiratory distress. She has no wheezes. She has no rales. She exhibits no tenderness.  Abdominal: Soft. Bowel sounds are normal. She exhibits no distension and no mass. There is tenderness. There is no rebound and no guarding.  +TTP LLQ with no guarding or rebound  Musculoskeletal: Normal range of  motion.  Neurological: She is alert and oriented to person, place, and time.  Psychiatric: She has a normal mood and affect.    ED Course  Procedures (including critical care time) Labs Review Labs Reviewed  COMPREHENSIVE METABOLIC PANEL - Abnormal; Notable for the following:    Glucose, Bld 108 (*)    Creatinine, Ser 0.47 (*)    Albumin 3.1 (*)    Alkaline Phosphatase 33 (*)    Total Bilirubin 0.2 (*)    All other components within normal limits  URINALYSIS, ROUTINE W REFLEX MICROSCOPIC - Abnormal; Notable for the following:    Color, Urine AMBER (*)     APPearance CLOUDY (*)    Specific Gravity, Urine 1.043 (*)    Ketones, ur 15 (*)    All other components within normal limits  POCT PREGNANCY, URINE - Abnormal; Notable for the following:    Preg Test, Ur POSITIVE (*)    All other components within normal limits  CBC WITH DIFFERENTIAL   Imaging Review No results found.  MDM   1. LLQ pain   2. Salmonella enteritis   3. Pregnancy     Filed Vitals:   10/25/12 2043 10/25/12 2233  BP: 111/64 101/65  Pulse: 80 78  Temp: 98 F (36.7 C) 98 F (36.7 C)  TempSrc: Oral Oral  Resp: 18 20  Weight: 154 lb (69.854 kg)   SpO2: 97% 100%     Shloka Luan Urbani is a 32 y.o. female with lab proven Salmonella enteritis, currently taking amoxicillin, no pain medication complaining of left lower quadrant pain. Patient is afebrile, vital signs are within normal limits. Serial abdominal exams remained benign and nonsurgical. Patient is also pregnant, last menstrual period was in June I am not concerned about ectopic. I encouraged patient to take pain medication to help her be more comfortable and to follow with OB/GYN.  Medications  morphine 4 MG/ML injection 4 mg (not administered)  0.9 %  sodium chloride infusion (not administered)    Followed by  0.9 %  sodium chloride infusion (not administered)    Pt is hemodynamically stable, appropriate for, and amenable to discharge at this time. Pt verbalized understanding and agrees with care plan. All questions answered. Outpatient follow-up and specific return precautions discussed.    New Prescriptions   HYDROCODONE-ACETAMINOPHEN (NORCO/VICODIN) 5-325 MG PER TABLET    Take 1-2 tablets by mouth every 6 hours as needed for pain.    Note: Portions of this report may have been transcribed using voice recognition software. Every effort was made to ensure accuracy; however, inadvertent computerized transcription errors may be present      Wynetta Emery, PA-C 10/26/12 0048

## 2012-10-25 NOTE — ED Notes (Signed)
Pt reports she saw her physician last week for same, was given medication which relieved the pain when she took it.  Woke up this morning with c/o pain LLQ abdomen.  Denies vomiting, diarrhea however stool today was soft.  Pt reports she has been feeling the baby move for approx one month but has not felt the baby move today at all.  [facility] April 15, 2013

## 2012-10-26 MED ORDER — HYDROCODONE-ACETAMINOPHEN 5-325 MG PO TABS: ORAL_TABLET | ORAL | Status: DC

## 2012-10-26 NOTE — ED Provider Notes (Addendum)
Medical screening examination/treatment/procedure(s) were performed by non-physician practitioner and as supervising physician I was immediately available for consultation/collaboration.   Lyanne Co, MD 11/01/12 2115

## 2012-10-26 NOTE — ED Notes (Signed)
Pt states she has to drive and is refusing morphine.  She also states she must leave and does not want to have the IV fluid.  Pt has refused both.

## 2012-11-09 LAB — OB RESULTS CONSOLE RPR: RPR: NONREACTIVE

## 2012-11-09 LAB — OB RESULTS CONSOLE ABO/RH: RH TYPE: POSITIVE

## 2012-11-09 LAB — OB RESULTS CONSOLE RUBELLA ANTIBODY, IGM: Rubella: IMMUNE

## 2012-11-09 LAB — OB RESULTS CONSOLE ANTIBODY SCREEN: ANTIBODY SCREEN: NEGATIVE

## 2012-11-09 LAB — STOOL CULTURE

## 2012-11-09 LAB — OB RESULTS CONSOLE HEPATITIS B SURFACE ANTIGEN: HEP B S AG: NEGATIVE

## 2012-11-09 LAB — OB RESULTS CONSOLE HIV ANTIBODY (ROUTINE TESTING): HIV: NONREACTIVE

## 2013-02-15 NOTE — L&D Delivery Note (Signed)
Delivery Note At 1:22 PM a viable female was delivered via Vaginal, Spontaneous Delivery (Presentation: ; Occiput Anterior).  APGAR: 9, 9; weight .   Placenta status: Intact, Spontaneous.  Cord: 3 vessels with the following complications: None.  Cord pH: not done  Anesthesia: None  Episiotomy: None Lacerations: None Suture Repair: 2.0 Est. Blood Loss (mL): 250  Mom to postpartum.  Baby to Couplet care / Skin to Skin.  Alisha Hall A 04/16/2013, 1:32 PM

## 2013-03-12 ENCOUNTER — Inpatient Hospital Stay (HOSPITAL_COMMUNITY)
Admission: AD | Admit: 2013-03-12 | Discharge: 2013-03-12 | Disposition: A | Discharge status: Home / Self Care | Payer: Self-pay | Source: Ambulatory Visit | Attending: Obstetrics | Admitting: Obstetrics

## 2013-03-12 ENCOUNTER — Encounter (HOSPITAL_COMMUNITY): Payer: Self-pay | Admitting: *Deleted

## 2013-03-12 DIAGNOSIS — O4700 False labor before 37 completed weeks of gestation, unspecified trimester: Secondary | ICD-10-CM | POA: Insufficient documentation

## 2013-03-12 DIAGNOSIS — O2390 Unspecified genitourinary tract infection in pregnancy, unspecified trimester: Secondary | ICD-10-CM | POA: Insufficient documentation

## 2013-03-12 DIAGNOSIS — R109 Unspecified abdominal pain: Secondary | ICD-10-CM | POA: Insufficient documentation

## 2013-03-12 DIAGNOSIS — O479 False labor, unspecified: Secondary | ICD-10-CM

## 2013-03-12 DIAGNOSIS — O2340 Unspecified infection of urinary tract in pregnancy, unspecified trimester: Secondary | ICD-10-CM

## 2013-03-12 DIAGNOSIS — N390 Urinary tract infection, site not specified: Secondary | ICD-10-CM | POA: Insufficient documentation

## 2013-03-12 HISTORY — DX: Other specified health status: Z78.9

## 2013-03-12 LAB — URINALYSIS, ROUTINE W REFLEX MICROSCOPIC
Bilirubin Urine: NEGATIVE
Glucose, UA: NEGATIVE mg/dL
Hgb urine dipstick: NEGATIVE
Ketones, ur: NEGATIVE mg/dL
NITRITE: NEGATIVE
PH: 6.5 (ref 5.0–8.0)
Protein, ur: NEGATIVE mg/dL
SPECIFIC GRAVITY, URINE: 1.01 (ref 1.005–1.030)
Urobilinogen, UA: 0.2 mg/dL (ref 0.0–1.0)

## 2013-03-12 LAB — URINE MICROSCOPIC-ADD ON

## 2013-03-12 MED ORDER — TERBUTALINE SULFATE 1 MG/ML IJ SOLN
0.2500 mg | Freq: Once | INTRAMUSCULAR | Status: AC
  Administered 2013-03-12: 0.25 mg via SUBCUTANEOUS
  Filled 2013-03-12: qty 1

## 2013-03-12 MED ORDER — NITROFURANTOIN MONOHYD MACRO 100 MG PO CAPS: 100.0000 mg | ORAL_CAPSULE | Freq: Two times a day (BID) | ORAL | Status: DC

## 2013-03-12 MED ORDER — LACTATED RINGERS IV BOLUS (SEPSIS)
1000.0000 mL | Freq: Once | INTRAVENOUS | Status: AC
  Administered 2013-03-12: 1000 mL via INTRAVENOUS

## 2013-03-12 NOTE — MAU Note (Signed)
Roney MarionW. Muhammad CNM at the bedside.

## 2013-03-12 NOTE — MAU Provider Note (Signed)
History     CSN: [id number]  Arrival date and time: 03/12/13 [id number]   None     Chief Complaint  Patient presents with  . Abdominal Cramping  . Back Pain   HPI Pt is a 33 yo G4P2103 at 270w2d wks IUP here with report of contractions starting at 1900 last night.  No report of vaginal bleeding or leaking of fluid.  +fetal movement.    Past Medical History  Diagnosis Date  . Medical history non-contributory     Past Surgical History  Procedure Laterality Date  . No past surgeries      Family History  Problem Relation Age of Onset  . Cancer Maternal Grandfather     History  Substance Use Topics  . Smoking status: Never Smoker   . Smokeless tobacco: Not on file  . Alcohol Use: No    Allergies: No Known Allergies  Prescriptions prior to admission  Medication Sig Dispense Refill  . ibuprofen (ADVIL,MOTRIN) 200 MG tablet Take 200 mg by mouth every 6 (six) hours as needed.      . Prenatal Vit-Fe Fumarate-FA (MULTIVITAMIN-PRENATAL) 27-0.8 MG TABS tablet Take 1 tablet by mouth daily at 12 noon.      Marland Kitchen. amoxicillin (AMOXIL) 500 MG capsule Take 500 mg by mouth 3 (three) times daily. Started medication 10-23-12      . HYDROcodone-acetaminophen (NORCO/VICODIN) 5-325 MG per tablet Take 1-2 tablets by mouth every 6 hours as needed for pain.  15 tablet  0  . metroNIDAZOLE (FLAGYL) 500 MG tablet Take 500 mg by mouth 3 (three) times daily. Started medication a week ago. Patient not sure of which day.        Review of Systems  Gastrointestinal: Positive for abdominal pain (contractions).  Genitourinary: Negative for dysuria, urgency and frequency.  All other systems reviewed and are negative.   Physical Exam   Blood pressure 112/68, pulse 81, temperature 98.1 F (36.7 C), temperature source Oral, resp. rate 18, height 5\' 1"  (1.549 m), weight 73.573 kg (162 lb 3.2 oz), last menstrual period 06/12/2012.  Physical Exam  Constitutional: She is oriented to person, place, and time. She  appears well-developed and well-nourished. No distress.  Appears uncomfortable  HENT:  Head: Normocephalic.  Neck: Normal range of motion. Neck supple.  Cardiovascular: Normal rate, regular rhythm and normal heart sounds.   Respiratory: Effort normal and breath sounds normal. No respiratory distress.  GI: Soft. There is no tenderness.  Genitourinary: No bleeding around the vagina. Vaginal discharge (mucusy) found.  Musculoskeletal: Normal range of motion. She exhibits no edema.  Neurological: She is alert and oriented to person, place, and time.  Skin: Skin is warm and dry.   Dilation: 1 Effacement (%): Thick Cervical Position: Posterior Presentation: Vertex Exam by:: Roney MarionW. Muhammad, CNM  MAU Course  Procedures Results for orders placed during the hospital encounter of 03/12/13 (from the past 24 hour(s))  URINALYSIS, ROUTINE W REFLEX MICROSCOPIC     Status: Abnormal   Collection Time    03/12/13  5:25 AM      Result Value Range   Color, Urine YELLOW  YELLOW   APPearance CLEAR  CLEAR   Specific Gravity, Urine 1.010  1.005 - 1.030   pH 6.5  5.0 - 8.0   Glucose, UA NEGATIVE  NEGATIVE mg/dL   Hgb urine dipstick NEGATIVE  NEGATIVE   Bilirubin Urine NEGATIVE  NEGATIVE   Ketones, ur NEGATIVE  NEGATIVE mg/dL   Protein, ur NEGATIVE  NEGATIVE mg/dL  Urobilinogen, UA 0.2  0.0 - 1.0 mg/dL   Nitrite NEGATIVE  NEGATIVE   Leukocytes, UA SMALL (*) NEGATIVE  URINE MICROSCOPIC-ADD ON     Status: None   Collection Time    03/12/13  5:25 AM      Result Value Range   Squamous Epithelial / LPF RARE  RARE   WBC, UA 7-10  <3 WBC/hpf   Bacteria, UA RARE  RARE   FHR 130's, +accels Toco - 2-5  0620 Consulted with Dr. Gaynell Face > reviewed HPI/exam/contraction pattern > IV hydration and subq terbutaline  0730 Cervical recheck, no change 1/thick/posterior; pt reports decrease in contractions and pain via interpreter.  Assessment and Plan  33 yo G4P2103 at [redacted]w[redacted]d wks IUP Deberah Pelton UTI  Plan: Discharge to home RX Macrobid Urine culture Reviewed preterm labor precautions.    [facility] 03/12/2013, 6:08 AM

## 2013-03-12 NOTE — Discharge Instructions (Signed)
Bacteriuria asintomática - En la mujer  ( Asymptomatic Bacteriuria, Female)  Se denomina bacteriuria asintomática cuando en la orina hay un número significativo de bacterias sin los síntomas habituales de ardor o deseos frecuentes de orinar. Las siguientes enfermedades aumentan el riesgo de bacteriuria asintomática:  · Diabetes mellitus.  · Edad avanzada.  · Embarazo Primer trimestre.  · Piedras en el riñón.  · Trasplantes de riñón.  · Filtración desde la vejiga hacia el riñón por falla valvular en los niños pequeños (reflujo).  En la mayoría de las personas no se requiere tratamiento para la bacteriuria asintomática y hacerlo puede derivar en otros problemas, como desarrollo acelerado de hongos y desarrollo de bacterias resistentes. Sin embargo, en ciertos casos, como en las mujeres embarazadas, se requiere tratamiento para prevenir una infección renal. La bacteriuria asintomática en el embarazo también se asocia a una restricción del desarrollo fetal, parto prematuro y muerte del recién nacido.  INSTRUCCIONES PARA EL CUIDADO EN EL HOGAR  Controle su bacteriuria para ver si hay cambios. Las siguientes acciones ayudarán a aliviar cualquier molestia que pueda experimentar:  · Beba gran cantidad de líquido para mantener la orina de tono claro o color amarillo pálido. Vaya al baño con más frecuencia para mantener la vejiga vacía.  · Mantenga limpia la zona que rodea la vagina y el recto. Higienícese de adelante hacia atrás después de orinar.  SOLICITE ATENCIÓN MÉDICA DE INMEDIATO SI:  · Desarrolla signos de infección como ardor al orinar, aumento de la frecuencia miccional, dolor en la espalda o fiebre.  · Observa sangre en la orina.  · Tiene fiebre.  Document Released: 02/01/2005 Document Revised: 10/04/2012  [employer]® Patient Information ©2014 [employer].

## 2013-03-12 NOTE — MAU Note (Signed)
Pt having cramping and lower back pain since Sunday morning. Denies bleeding or discharge.

## 2013-03-12 NOTE — MAU Note (Signed)
Finish fluids and then d/c.

## 2013-03-13 LAB — URINE CULTURE

## 2013-03-20 LAB — OB RESULTS CONSOLE GC/CHLAMYDIA
CHLAMYDIA, DNA PROBE: NEGATIVE
GC PROBE AMP, GENITAL: NEGATIVE

## 2013-03-28 ENCOUNTER — Encounter (HOSPITAL_COMMUNITY): Payer: Self-pay | Admitting: *Deleted

## 2013-03-28 ENCOUNTER — Inpatient Hospital Stay (HOSPITAL_COMMUNITY)
Admission: AD | Admit: 2013-03-28 | Discharge: 2013-03-29 | Disposition: A | Discharge status: Home / Self Care | Payer: Self-pay | Source: Ambulatory Visit | Attending: Obstetrics | Admitting: Obstetrics

## 2013-03-28 DIAGNOSIS — O479 False labor, unspecified: Secondary | ICD-10-CM | POA: Insufficient documentation

## 2013-03-28 NOTE — MAU Note (Signed)
Pt reports contractions. Denies bleeding or ROM.

## 2013-03-29 MED ORDER — ZOLPIDEM TARTRATE 5 MG PO TABS
5.0000 mg | ORAL_TABLET | Freq: Once | ORAL | Status: AC
  Administered 2013-03-29: 5 mg via ORAL
  Filled 2013-03-29: qty 1

## 2013-03-29 NOTE — Discharge Instructions (Signed)
Tercer trimestre del Psychiatrist  (Third Trimester of Pregnancy) El tercer trimestre del embarazo abarca desde la semana 29 hasta la semana 42, desde el 7 mes hasta el 9. En este trimestre el feto se desarrolla muy rpidamente. Hacia el final del noveno mes, el beb que an no ha nacido mide alrededor de 20 pulgadas (45 cm) de largo y pesa entre 6 y 10 libras (2,700 y 4,500 kg).  CAMBIOS CORPORALES  Su organismo atravesar numerosos cambios durante el [place]. Los cambios varan de una mujer a Educational psychologist.   Seguir [employer]. Es esperable que aumente entre 25 y 35 libras (11 16 kg) hacia el final del [place].  Podrn aparecer las primeras estras en las caderas, abdomen y [place].  Tendr necesidad de Geographical information systems officer con ms frecuencia porque el feto baja hacia la pelvis y presiona en la vejiga.  Como consecuencia del Psychiatrist, podr sentir Actor.  Podr estar constipada ya que ciertas hormonas hacen que los msculos que hacen progresar los desechos a travs de los intestinos trabajen ms lentamente.  Pueden aparecer hemorroides o abultarse e hincharse las venas (venas varicosas).  Podr sentir dolor plvico debido al [employer] de peso ya que las hormonas del embarazo relajan las articulaciones entre los huesos de la pelvis. El dolor de espalda puede ser consecuencia de la exigencia de los msculos que soportan la [place].  Sus mamas seguirn desarrollndose y estarn ms sensibles. A veces sale Veterinary surgeon de las [place], que se llama Product manager.  El ombligo puede salir hacia afuera.  Podr sentir [employer] falta el aire debido a que se expande el tero.  Podr notar que el feto "baja" o que se siente ms bajo en el abdomen.  Podr tener una prdida de secrecin mucosa con sangre. Esto suele ocurrir [employer] unos [address] y [place] semana antes del [place].  El cuello se vuelve delgado y blando (se borra) cerca de la fecha de [place]. QU DEBE ESPERAR EN LAS CONSULTAS  PRENATALES  Le harn exmenes prenatales cada 2 semanas hasta la semana 36. A partir de ese momento le harn exmenes semanales. Durante una visita prenatal de rutina:   La pesarn para verificar que usted y el feto se encuentran dentro de los lmites normales.  Le tomarn la presin arterial.  Le medirn el abdomen para verificar el desarrollo del beb.  Escucharn los latidos fetales.  Se evaluarn los resultados de los estudios solicitados en visitas anteriores.  Le controlarn el cuello del tero cuando est prxima la fecha de parto para ver si se ha borrado. Alrededor de la semana 36 el mdico controlar el cuello del tero. Al mismo tiempo realizar un anlisis de las secreciones del tejido vaginal. Este examen es para determinar si hay un tipo de bacteria, estreptococo Grupo B. El mdico le explicar esto con ms detalle.  El mdico podr preguntarle:   DIRECTV gustara que fuera el [place].  Cmo se siente.  Si siente los movimientos del beb.  Si tiene sntomas anormales, como prdida de lquido, [place], dolores de cabeza intenso o clicos abdominales.  Si tiene [place] duda. Otros estudios que podrn realizarse durante el tercer trimestre son:   Anlisis de sangre para controlar sus niveles de hierro (anemia).  Controles fetales para determinar su salud, el nivel de [place] y su desarrollo. Si tiene [place] enfermedad o si tuvo problemas durante el [place], le harn estudios. FALSO TRABAJO DE PARTO  Es posible que sienta contracciones pequeas e irregulares que finalmente  desaparecen. Se llaman contracciones de [address] o falso trabajo de [place]. Las contracciones pueden durar horas, [address]  [address] o an Retail buyer antes de que el verdadero trabajo de parto se inicie. Si las contracciones tienen intervalos regulares, se intensifican o se hacen dolorosas, lo mejor es que la revise su mdico.  SIGNOS DE TRABAJO DE PARTO   Espasmos del tipo menstrual.  Contracciones cada 5  minutos o menos.  Contracciones que comienzan en la parte superior del tero y se expanden hacia abajo, a la zona inferior del abdomen y la espalda.  Sensacin de presin que aumenta en la pelvis o dolor en la espalda.  Aparece una secrecin acuosa o sanguinolenta por la vagina. Si tiene alguno de estos signos antes de la semana 37 del embarazo, llame a su mdico inmediatamente. Debe concurrir al hospital para ser controlada inmediatamente.  INSTRUCCIONES PARA EL CUIDADO EN EL HOGAR   Evite fumar, consumir hierbas, beber alcohol y Chemical engineer frmacos que no le hayan recetado. Estas sustancias qumicas afectan la formacin y el desarrollo del beb.  Siga las indicaciones del profesional con respecto a Adult nurse. Durante el embarazo, hay medicamentos que son seguros y otros no lo son.  Realice actividad fsica slo segn las indicaciones del mdico. Sentir clicos uterinos es el mejor signo para Restaurant manager, fast food actividad fsica.  Contine haciendo comidas regulares y sanas.  Use un sostn que le brinde buen soporte si sus mamas estn sensibles.  No utilice la baera con agua caliente, baos turcos o saunas.  Colquese el cinturn de seguridad cuando conduzca.  Evite comer carne cruda queso sin cocinar y el contacto con los utensilios y desperdicios de los gatos. Estos elementos contienen grmenes que pueden causar defectos de nacimiento en el beb.  Tome las vitaminas indicadas para la etapa prenatal.  Pruebe un laxante (si el mdico la autoriza) si tiene constipacin. Consuma ms alimentos ricos en fibra, como vegetales y frutas frescos y cereales enteros. Beba gran cantidad de lquido para mantener la orina de tono claro o amarillo plido.  Tome baos de agua tibia para Primary school teacher o las molestias causadas por las hemorroides. Use una crema para las hemorroides si el mdico la autoriza.  Si tiene venas varicosas, use medias de soporte. Eleve los pies durante 15 minutos,  3 4 veces por da. Limite el consumo de sal en su dieta.  Evite levantar objetos pesados, use zapatos de tacones bajos y [place].  Descanse con las piernas elevadas si tiene calambres o dolor de cintura.  Visite a su dentista si no lo ha Occupational hygienist. Use un cepillo de dientes blando para higienizarse los dientes y use suavemente el hilo dental.  Puede continuar su vida sexual excepto que el mdico le indique otra cosa.  No haga viajes largos excepto que sea absolutamente necesario y slo con la aprobacin de su mdico.  Tome clases prenatales para entender, Education administrator y hacer preguntas sobre el [place] de parto y el alumbramiento.  Haga un ensayo sobre la partida al hospital.  Prepare el bolso que llevar al hospital.  Prepare la habitacin del beb.  Contine concurriendo a todas las visitas prenatales segn las indicaciones de su mdico. SOLICITE ATENCIN MDICA SI:   No est segura si est en trabajo de parto o ha roto la bolsa de aguas.  Tiene mareos.  Siente clicos leves, presin en la pelvis o dolor persistente en el abdomen.  Tiene nuseas o vmitos persistentes.  Alisha Hall  secrecin vaginal con mal olor.  Siente dolor al [employer]. SOLICITE ATENCIN MDICA DE INMEDIATO SI:   Tiene fiebre.  Pierde lquido o sangre por la vagina.  Tiene sangrado o pequeas prdidas vaginales.  Siente dolor intenso o clicos en el abdomen.  Sube o baja de peso rpidamente.  Le falta el aire y le duele el pecho al respirar.  Sbitamente se le hincha el rostro, las manos, los tobillos, los pies o las piernas de [place].  No ha sentido los movimientos del beb durante Alisha Hall.  Siente un dolor de cabeza intenso que no se alivia con medicamentos.  Su visin se modifica. Document Released: 11/11/2004 Document Revised: 10/04/2012 [facility] Patient Information 2014 [place], [place]. Evaluacin de los movimientos fetales  (Fetal Movement  Counts) Nombre del paciente: __________________________________________________ Alisha Hall estimada: ____________________ Alisha Hall de los movimientos fetales es muy recomendable en los embarazos de alto riesgo, pero tambin es una buena idea que lo hagan todas las [place]. El Firefighter que comience a contarlos a las 28 semanas de [place]. Los movimientos fetales suelen aumentar:   Despus de Animator.  Despus de la actividad fsica.  Despus de comer o beber [employer] o fro.  En reposo. Preste atencin cuando sienta que el beb est ms activo. Esto le ayudar a notar un patrn de ciclos de vigilia y sueo de su beb y cules son los factores que contribuyen a un aumento de los movimientos fetales. Es importante llevar a cabo un recuento de movimientos fetales, al mismo tiempo cada da, cuando el beb normalmente est ms activo.  CMO CONTAR LOS MOVIMIENTOS FETALES 1. Busque un lugar tranquilo y cmodo para sentarse o recostarse sobre el lado izquierdo. Al recostarse sobre su lado izquierdo, le proporciona una mejor circulacin de [place] y oxgeno al beb. 2. Anote el da y la hora en una hoja de papel o en un diario. 3. Comience contando las pataditas, revoloteos, chasquidos, vueltas o pinchazos en un perodo de 2 horas. Debe sentir al menos 10 movimientos en 2 horas. 4. Si no siente 10 movimientos en 2 horas, espere 2  3 horas y cuente de nuevo. Busque cambios en el patrn o si no cuenta lo suficiente en 2 horas. SOLICITE ATENCIN MDICA SI:   Siente menos de 10 pataditas en 2 horas, en dos intentos.  No hay movimientos durante una hora.  El patrn se modifica o le lleva ms tiempo Art gallery manager las 10 pataditas.  Siente que el beb no se mueve como lo hace habitualmente. Fecha: ____________ Movimientos: ____________ Alisha Hall inicio: ____________ Alisha Hall finalizacin: ____________  Alisha Hall: ____________ Movimientos: ____________ Alisha Hall inicio:  ____________ Alisha Hall finalizacin: ____________  Alisha Hall: ____________ Movimientos: ____________ Alisha Hall inicio: ____________ Alisha Hall finalizacin: ____________  Alisha Hall: ____________ Movimientos: ____________ Alisha Hall inicio: ____________ Alisha Hall finalizacin: ____________  Alisha Hall: ____________ Movimientos: ____________ Alisha Hall inicio: ____________ Alisha Hall de finalizacin: ____________  Alisha Hall: ____________ Movimientos: ____________ Alisha Hall de inicio: ____________ Alisha Hall de finalizacin: ____________  Alisha Hall: ____________ Movimientos: ____________ Alisha Hall de inicio: ____________ Alisha Hall de finalizacin: ____________  Alisha Hall: ____________ Movimientos: ____________ Alisha Hall de inicio: ____________ Alisha Hall de finalizacin: ____________  Alisha Hall: ____________ Movimientos: ____________ Alisha Hall inicio: ____________ Alisha Hall de finalizacin: ____________  Alisha Hall: ____________ Movimientos: ____________ Alisha Hall inicio: ____________ Alisha Hall finalizacin: ____________  Alisha Hall: ____________ Movimientos: ____________ Alisha Hall inicio: ____________ Alisha Hall finalizacin: ____________  Alisha Hall: ____________ Movimientos: ____________ Alisha Hall inicio: ____________ Alisha Hall finalizacin: ____________  Alisha Hall: ____________ Movimientos: ____________  Hora de inicio: ____________ Alisha BornHora de finalizacin: ____________  Alisha NonesFecha: ____________ Movimientos: ____________ Alisha BornHora de inicio: ____________ Alisha BornHora de finalizacin: ____________  Alisha NonesFecha: ____________ Movimientos: ____________ Alisha BornHora de inicio: ____________ Alisha BornHora de finalizacin: ____________  Alisha NonesFecha: ____________ Movimientos: ____________ Alisha BornHora de inicio: ____________ Alisha BornHora de finalizacin: ____________  Alisha NonesFecha: ____________ Movimientos: ____________ Alisha BornHora de inicio: ____________ Alisha BornHora de finalizacin: ____________  Alisha NonesFecha: ____________ Movimientos: ____________ Alisha BornHora de inicio: ____________ Alisha BornHora de finalizacin: ____________  Alisha NonesFecha: ____________ Movimientos: ____________ Alisha BornHora de inicio: ____________ Alisha RussianHora de finalizacin:  ____________  Alisha NonesFecha: ____________ Movimientos: ____________ Alisha RussianHora de inicio: ____________ Alisha RussianHora de finalizacin: ____________  Alisha NonesFecha: ____________ Movimientos: ____________ Alisha RussianHora de inicio: ____________ Alisha RussianHora de finalizacin: ____________  Alisha NonesFecha: ____________ Movimientos: ____________ Alisha RussianHora de inicio: ____________ Alisha RussianHora de finalizacin: ____________  Alisha NonesFecha: ____________ Movimientos: ____________ Alisha RussianHora de inicio: ____________ Alisha RussianHora de finalizacin: ____________  Alisha NonesFecha: ____________ Movimientos: ____________ Alisha RussianHora de inicio: ____________ Alisha RussianHora de finalizacin: ____________  Alisha NonesFecha: ____________ Movimientos: ____________ Alisha RussianHora de inicio: ____________ Alisha RussianHora de finalizacin: ____________  Alisha NonesFecha: ____________ Movimientos: ____________ Alisha RussianHora de inicio: ____________ Alisha RussianHora de finalizacin: ____________  Alisha NonesFecha: ____________ Movimientos: ____________ Alisha RussianHora de inicio: ____________ Alisha RussianHora de finalizacin: ____________  Alisha NonesFecha: ____________ Movimientos: ____________ Alisha RussianHora de inicio: ____________ Alisha RussianHora de finalizacin: ____________  Alisha NonesFecha: ____________ Movimientos: ____________ Alisha RussianHora de inicio: ____________ Alisha RussianHora de finalizacin: ____________  Alisha NonesFecha: ____________ Movimientos: ____________ Alisha RussianHora de inicio: ____________ Alisha RussianHora de finalizacin: ____________  Alisha NonesFecha: ____________ Movimientos: ____________ Alisha RussianHora de inicio: ____________ Alisha RussianHora de finalizacin: ____________  Alisha NonesFecha: ____________ Movimientos: ____________ Alisha RussianHora de inicio: ____________ Alisha RussianHora de finalizacin: ____________  Alisha NonesFecha: ____________ Movimientos: ____________ Alisha RussianHora de inicio: ____________ Alisha RussianHora de finalizacin: ____________  Alisha NonesFecha: ____________ Movimientos: ____________ Alisha RussianHora de inicio: ____________ Alisha RussianHora de finalizacin: ____________  Alisha NonesFecha: ____________ Movimientos: ____________ Alisha RussianHora de inicio: ____________ Alisha RussianHora de finalizacin: ____________  Alisha NonesFecha: ____________ Movimientos: ____________ Alisha RussianHora de inicio: ____________ Alisha RussianHora de finalizacin: ____________  Alisha NonesFecha: ____________  Movimientos: ____________ Alisha RussianHora de inicio: ____________ Alisha RussianHora de finalizacin: ____________  Alisha NonesFecha: ____________ Movimientos: ____________ Alisha RussianHora de inicio: ____________ Alisha RussianHora de finalizacin: ____________  Alisha NonesFecha: ____________ Movimientos: ____________ Alisha RussianHora de inicio: ____________ Alisha RussianHora de finalizacin: ____________  Alisha NonesFecha: ____________ Movimientos: ____________ Alisha RussianHora de inicio: ____________ Alisha RussianHora de finalizacin: ____________  Alisha NonesFecha: ____________ Movimientos: ____________ Alisha RussianHora de inicio: ____________ Alisha RussianHora de finalizacin: ____________  Alisha NonesFecha: ____________ Movimientos: ____________ Alisha RussianHora de inicio: ____________ Alisha RussianHora de finalizacin: ____________  Alisha NonesFecha: ____________ Movimientos: ____________ Alisha RussianHora de inicio: ____________ Alisha RussianHora de finalizacin: ____________  Alisha NonesFecha: ____________ Movimientos: ____________ Alisha RussianHora de inicio: ____________ Alisha RussianHora de finalizacin: ____________  Alisha NonesFecha: ____________ Movimientos: ____________ Alisha RussianHora de inicio: ____________ Alisha RussianHora de finalizacin: ____________  Alisha NonesFecha: ____________ Movimientos: ____________ Alisha RussianHora de inicio: ____________ Alisha RussianHora de finalizacin: ____________  Alisha NonesFecha: ____________ Movimientos: ____________ Alisha RussianHora de inicio: ____________ Alisha RussianHora de finalizacin: ____________  Alisha NonesFecha: ____________ Movimientos: ____________ Alisha RussianHora de inicio: ____________ Alisha RussianHora de finalizacin: ____________  Alisha NonesFecha: ____________ Movimientos: ____________ Alisha RussianHora de inicio: ____________ Alisha RussianHora de finalizacin: ____________  Alisha NonesFecha: ____________ Movimientos: ____________ Alisha RussianHora de inicio: ____________ Alisha RussianHora de finalizacin: ____________  Alisha NonesFecha: ____________ Movimientos: ____________ Alisha RussianHora de inicio: ____________ Alisha RussianHora de finalizacin: ____________  Alisha NonesFecha: ____________ Movimientos: ____________ Alisha RussianHora de inicio: ____________ Alisha RussianHora de finalizacin: ____________  Alisha NonesFecha: ____________ Movimientos: ____________ Alisha RussianHora de inicio: ____________ Alisha RussianHora de finalizacin: ____________  Alisha NonesFecha: ____________ Movimientos: ____________ Alisha RussianHora de inicio:  ____________ Alisha RussianHora de finalizacin: ____________  Alisha NonesFecha: ____________ Movimientos: ____________ Alisha RussianHora de inicio: ____________ Alisha RussianHora de finalizacin: ____________  Alisha NonesFecha: ____________ Movimientos: ____________ Alisha RussianHora de inicio: ____________ Alisha RussianHora de finalizacin: ____________  Document Released: 05/11/2007 Document Revised: 01/19/2012 [employer] Patient Information 2014 [employer].

## 2013-04-10 ENCOUNTER — Inpatient Hospital Stay (HOSPITAL_COMMUNITY)
Admission: AD | Admit: 2013-04-10 | Discharge: 2013-04-10 | Disposition: A | Discharge status: Home / Self Care | Payer: Self-pay | Source: Ambulatory Visit | Attending: Obstetrics | Admitting: Obstetrics

## 2013-04-10 ENCOUNTER — Encounter (HOSPITAL_COMMUNITY): Payer: Self-pay | Admitting: *Deleted

## 2013-04-10 DIAGNOSIS — O479 False labor, unspecified: Secondary | ICD-10-CM | POA: Insufficient documentation

## 2013-04-10 DIAGNOSIS — O368190 Decreased fetal movements, unspecified trimester, not applicable or unspecified: Secondary | ICD-10-CM | POA: Insufficient documentation

## 2013-04-10 DIAGNOSIS — K6289 Other specified diseases of anus and rectum: Secondary | ICD-10-CM | POA: Insufficient documentation

## 2013-04-10 DIAGNOSIS — K5900 Constipation, unspecified: Secondary | ICD-10-CM | POA: Insufficient documentation

## 2013-04-10 DIAGNOSIS — O228X9 Other venous complications in pregnancy, unspecified trimester: Secondary | ICD-10-CM | POA: Insufficient documentation

## 2013-04-10 DIAGNOSIS — K644 Residual hemorrhoidal skin tags: Secondary | ICD-10-CM | POA: Insufficient documentation

## 2013-04-10 MED ORDER — PHENYLEPH-SHARK LIV OIL-MO-PET 0.25-3-14-71.9 % RE OINT: 1.0000 "application " | TOPICAL_OINTMENT | Freq: Two times a day (BID) | RECTAL | Status: DC | PRN

## 2013-04-10 MED ORDER — DOCUSATE SODIUM 100 MG PO CAPS: 100.0000 mg | ORAL_CAPSULE | Freq: Two times a day (BID) | ORAL | Status: DC

## 2013-04-10 NOTE — MAU Note (Signed)
Pt states she felt pain and burning tonight

## 2013-04-10 NOTE — MAU Provider Note (Signed)
  History     CSN: [id number]  Arrival date and time: 04/10/13 2142   First Provider Initiated Contact with Patient 04/10/13 2245      Chief Complaint  Patient presents with  . Rectal Pain   HPI Ms. Alisha Hall is a 33 y.o. [phone] at [phone] who presents to MAU today with complaint of rectal pain since tonight. The patient states a recent history of constipation and noted significant pain tonight with BM. She denies bleeding. She denies LOF today. She is having irregular mild contractions. She also reports somewhat decreased FM tonight.   OB History   Grav Para Term Preterm Abortions TAB SAB Ect Mult Living   4 3 2 1      3       Past Medical History  Diagnosis Date  . Medical history non-contributory     Past Surgical History  Procedure Laterality Date  . No past surgeries      Family History  Problem Relation Age of Onset  . Cancer Maternal Grandfather     History  Substance Use Topics  . Smoking status: Never Smoker   . Smokeless tobacco: Not on file  . Alcohol Use: No    Allergies: No Known Allergies  Prescriptions prior to admission  Medication Sig Dispense Refill  . metroNIDAZOLE (FLAGYL) 500 MG tablet Take 500 mg by mouth 3 (three) times daily. Started medication a week ago. Patient not sure of which day.      . nitrofurantoin, macrocrystal-monohydrate, (MACROBID) 100 MG capsule Take 1 capsule (100 mg total) by mouth 2 (two) times daily.  14 capsule  0  . Prenatal Vit-Fe Fumarate-FA (MULTIVITAMIN-PRENATAL) 27-0.8 MG TABS tablet Take 1 tablet by mouth daily at 12 noon.        Review of Systems  Constitutional: Negative for fever.  Gastrointestinal: Positive for constipation. Negative for nausea, vomiting, abdominal pain and diarrhea.  Genitourinary:       Neg - vaginal bleeding, discharge, LOF   Physical Exam   Blood pressure 116/74, pulse 88, temperature 98 F (36.7 C), temperature source Oral, resp. rate 18, height 5' (1.524 m),  weight 170 lb 8 oz (77.338 kg), last menstrual period 06/12/2012.  Physical Exam  Constitutional: She is oriented to person, place, and time. She appears well-developed and well-nourished. No distress.  HENT:  Head: Normocephalic and atraumatic.  Cardiovascular: Normal rate.   Genitourinary: Rectal exam shows external hemorrhoid. Rectal exam shows no fissure.  Neurological: She is oriented to person, place, and time.  Skin: Skin is warm and dry.  Psychiatric: She has a normal mood and affect.  Dilation: 2.5 Effacement (%): 70 Cervical Position: Middle Station: -2 Presentation: Vertex Exam by:: belinda  Fetal Monitoring: Baseline: 120 bpm, moderate variability, + accelerations, no decelerations Contractions: q 6 minutes, mild  MAU Course  Procedures None  MDM Discussed increased fiber and stool softeners Cervix unchanged from recent office visit. Contractions are mild.  Assessment and Plan  A: External hemorrhoid  P: Discharge home Rx for Preparation H and Colace given to patient Patient advised to increase dietary fiber and use ice packs to the area for relief Labor precautions discussed Patient encouraged to follow-up with Dr. Gaynell FaceMarshall as scheduled for routine prenatal care Patient may return to MAU as needed or if her condition were to change or worsen  Freddi StarrJulie N Ethier, PA-C  04/10/2013, 10:45 PM

## 2013-04-10 NOTE — MAU Note (Addendum)
PT  SAYS  WITH INTERPRETER- [place]     THAT SHE GETS [facility]  WITH DR MARSHALL.  SEEN LAST IN OFFICE -  Thursday- VE  3 CM.   DENIES HSV AND MRSA.    SAYS TONIGHT  HAD BM- THEN SHE FELT SOMETHING  LIKE FLESH AT [facility]- HUSBAND CHECKED-

## 2013-04-10 NOTE — Discharge Instructions (Signed)
Contenido de [place] de los alimentos ([employer] in Foods) Beber lquidos en abundancia y consumir alimentos ricos en fibra ayuda a combatir la constipacin. A continuacin podr observar el contenido de fibra de algunos alimentos.  Almidones y granos / Media planner (g)  Cheerios, 1 taza / 3 g  [employer], 1 taza / 0.7 g  Rice Krispies, 1  taza / 0.3 g  [employer],  taza / 2.1 g  Avena instantnea (cocida),  taza / 2 g  Kellogg's Frosted Mini Wheats, 1 taza / 5.1 g  Arroz marrn grano largo (cocido), 1 taza / 3,5 g  Arroz blanco grano largo (cocido), 1 taza / 0,6 g  Macarrones enriquecidos (cocidos), 1 taza / 2,5 g Legumbres / Fibra Diettica (g)  Frijoles cocidos (enlatados, crudos o vegetarianos),  taza / 5,2 g  Frijoles rin, enlatados,  taza / 6,8 g  Judas pinto, (cocidas),  taza / 7,7 g  Frijoles pintos (enlatados),  taza / 5,5 g Panes y Gaffer / Media planner (g)  Galletas de Martin, simples o de miel, 2 cuadrados / 0,7 g  Galletas saladas, 3 unidades / 0,3 g  Pretzels, sencillos, salados, 10 trozos / 1,8 g  Pan integral, 1 rebanada / 1,9 g  Pan blanco, 1 rebanada / 0,7 g  Pan con pasas, 1 rebanada / 1,2 g  Bagel, sencillo, 3 onzas / 2 g  Tortilla de harina, 1 onza / 0,9 g  Tortilla de maz, 1 pequea / 1,5 g  Bun, hamburguesa o hot dog, 1 pequeo / 0,9 g Frutas / Fibra diettica (g)  Manzana, cruda con piel, 1 mediana / 4,4 g  Pur de manzanas, endulzado  taza / 1,5 g  Pltano,  mediano / 1,5 g  Uvas, 10 uvas / 0,4 g  Naranja, 1 pequea / 2,3 g  Pasas, 1,5 oz / 1,6 g  Meln, 1 taza / 1,4 g Vegetales / Fibra Diettica (g)  Judas verdes (en conserva),  taza / 1,3 g  Zanahorias (cocido),  taza / 2,3 g  Broccoli (cocido),  taza / 2,8 g  Guisantes congelados (cocidos),  taza / 4,4 g  Pur de papas,  taza / 1,6 g  Lechuga, 1 taza / 0,5 g  Maz (en lata),  taza / 1,6 g  Tomate,  taza  / 1,1 g 1 cup / 3 g. Document Released: 05/29/2012 [facility] Patient Information 2014 [place], [place]. Hemorroides  (Hemorrhoids)  Las hemorroides son venas inflamadas (hinchadas) alrededor del recto o del ano. Las hemorroides causan dolor, picazn, sangrado o irritacin.  CUIDADOS EN EL HOGAR   Consuma alimentos con fibra, como cereales integrales, legumbres, frutos secos, frutas y verduras. Pregntele a su mdico acerca de tomar productos con fibra aadida en ellos (suplementos defibra).  Beba gran cantidad de lquido para mantener el pis (orina) de tono claro o amarillo plido.  Haga ejercicio a menudo.  Vaya al bao cuando sienta la necesidad de ir de cuerpo. No espere.  Evite hacer fuerza al ir de cuerpo Contractor intestino).Shan Levans la zona anal limpia y seca. Use papel higinico mojado o toallitas de papel humedecidas.  Puede utilizar o Contractor segn las indicaciones las cremas recetadas y los medicamentos que se aplican en el ano (supositorio anal )  Slo tome los medicamentos que le indique el mdico.  Tome un bao de agua tibia (bao de asiento) durante 15 a 20 minutos para Engineer, materials. Repita 3  4 veces por  da.  AES CorporationColoque una bolsa de hielo sobre la zona si le duele o est inflamada. Use compresas de hielo Marriottentre los baos de agua tibia.  Ponga el hielo en una bolsa plstica.  Colquese una toalla entre la piel y la bolsa de hielo.  Deje el hielo en el lugar durante 15 a 20 minutos, 3 a 4 veces por da.  No utilice una almohada en forma de aro ni se siente en el inodoro durante perodos prolongados. SOLICITE AYUDA DE INMEDIATO SI:   Aumenta el dolor y no puede controlarlo con la medicacin o con Pharmacist, communityun tratamiento.  Tiene una hemorragia que no se detiene.  Tiene dificultado o no puede ir de cuerpo Contractor(mover el intestino).  Siente dolor o tiene inflamacin fuera de la zona de las hemorroides. ASEGRESE DE QUE:   Comprende estas instrucciones.  Controlar su  enfermedad.  Solicitar ayuda de inmediato si no mejora o si empeora. Document Released: 05/29/2012 [facility] Patient Information 2014 [place], [place].

## 2013-04-16 ENCOUNTER — Inpatient Hospital Stay (HOSPITAL_COMMUNITY)
Admission: AD | Admit: 2013-04-16 | Discharge: 2013-04-17 | DRG: 775 | Disposition: A | Discharge status: Home / Self Care | Payer: Medicaid Other | Source: Ambulatory Visit | Attending: Obstetrics | Admitting: Obstetrics

## 2013-04-16 ENCOUNTER — Encounter (HOSPITAL_COMMUNITY): Payer: Self-pay | Admitting: *Deleted

## 2013-04-16 DIAGNOSIS — IMO0001 Reserved for inherently not codable concepts without codable children: Secondary | ICD-10-CM

## 2013-04-16 LAB — CBC
HCT: 37.8 % (ref 36.0–46.0)
HEMOGLOBIN: 12.9 g/dL (ref 12.0–15.0)
MCH: 28.9 pg (ref 26.0–34.0)
MCHC: 34.1 g/dL (ref 30.0–36.0)
MCV: 84.8 fL (ref 78.0–100.0)
Platelets: 182 10*3/uL (ref 150–400)
RBC: 4.46 MIL/uL (ref 3.87–5.11)
RDW: 14.2 % (ref 11.5–15.5)
WBC: 10 10*3/uL (ref 4.0–10.5)

## 2013-04-16 LAB — RPR: RPR Ser Ql: NONREACTIVE

## 2013-04-16 LAB — OB RESULTS CONSOLE GBS: GBS: NEGATIVE

## 2013-04-16 LAB — TYPE AND SCREEN
ABO/RH(D): A POS
Antibody Screen: NEGATIVE

## 2013-04-16 LAB — ABO/RH: ABO/RH(D): A POS

## 2013-04-16 MED ORDER — TETANUS-DIPHTH-ACELL PERTUSSIS 5-2.5-18.5 LF-MCG/0.5 IM SUSP
0.5000 mL | Freq: Once | INTRAMUSCULAR | Status: AC
  Administered 2013-04-17: 0.5 mL via INTRAMUSCULAR
  Filled 2013-04-16: qty 0.5

## 2013-04-16 MED ORDER — BENZOCAINE-MENTHOL 20-0.5 % EX AERO
1.0000 "application " | INHALATION_SPRAY | CUTANEOUS | Status: DC | PRN
  Administered 2013-04-17: 1 via TOPICAL
  Filled 2013-04-16: qty 56

## 2013-04-16 MED ORDER — IBUPROFEN 600 MG PO TABS
600.0000 mg | ORAL_TABLET | Freq: Four times a day (QID) | ORAL | Status: DC
  Administered 2013-04-16 – 2013-04-17 (×4): 600 mg via ORAL
  Filled 2013-04-16 (×4): qty 1

## 2013-04-16 MED ORDER — SIMETHICONE 80 MG PO CHEW: 80.0000 mg | CHEWABLE_TABLET | ORAL | Status: DC | PRN

## 2013-04-16 MED ORDER — NALOXONE HCL 0.4 MG/ML IJ SOLN
INTRAMUSCULAR | Status: AC
  Filled 2013-04-16: qty 1

## 2013-04-16 MED ORDER — CITRIC ACID-SODIUM CITRATE 334-500 MG/5ML PO SOLN: 30.0000 mL | ORAL | Status: DC | PRN

## 2013-04-16 MED ORDER — LACTATED RINGERS IV SOLN
INTRAVENOUS | Status: DC
  Administered 2013-04-16: 11:00:00 via INTRAVENOUS

## 2013-04-16 MED ORDER — WITCH HAZEL-GLYCERIN EX PADS: 1.0000 "application " | MEDICATED_PAD | CUTANEOUS | Status: DC | PRN

## 2013-04-16 MED ORDER — BUTORPHANOL TARTRATE 1 MG/ML IJ SOLN
1.0000 mg | INTRAMUSCULAR | Status: DC | PRN
  Administered 2013-04-16 (×2): 1 mg via INTRAVENOUS
  Filled 2013-04-16 (×2): qty 1

## 2013-04-16 MED ORDER — ONDANSETRON HCL 4 MG/2ML IJ SOLN: 4.0000 mg | Freq: Four times a day (QID) | INTRAMUSCULAR | Status: DC | PRN

## 2013-04-16 MED ORDER — ONDANSETRON HCL 4 MG PO TABS: 4.0000 mg | ORAL_TABLET | ORAL | Status: DC | PRN

## 2013-04-16 MED ORDER — SENNOSIDES-DOCUSATE SODIUM 8.6-50 MG PO TABS
2.0000 | ORAL_TABLET | ORAL | Status: DC
Start: 2013-04-17 — End: 2013-04-17
  Administered 2013-04-16: 2 via ORAL
  Filled 2013-04-16: qty 2

## 2013-04-16 MED ORDER — FERROUS SULFATE 325 (65 FE) MG PO TABS
325.0000 mg | ORAL_TABLET | Freq: Two times a day (BID) | ORAL | Status: DC
  Administered 2013-04-16 – 2013-04-17 (×2): 325 mg via ORAL
  Filled 2013-04-16 (×4): qty 1

## 2013-04-16 MED ORDER — OXYCODONE-ACETAMINOPHEN 5-325 MG PO TABS: 1.0000 | ORAL_TABLET | ORAL | Status: DC | PRN

## 2013-04-16 MED ORDER — DIBUCAINE 1 % RE OINT: 1.0000 "application " | TOPICAL_OINTMENT | RECTAL | Status: DC | PRN

## 2013-04-16 MED ORDER — LIDOCAINE HCL (PF) 1 % IJ SOLN
30.0000 mL | INTRAMUSCULAR | Status: DC | PRN
  Filled 2013-04-16: qty 30

## 2013-04-16 MED ORDER — OXYTOCIN 40 UNITS IN LACTATED RINGERS INFUSION - SIMPLE MED
62.5000 mL/h | INTRAVENOUS | Status: DC
  Filled 2013-04-16: qty 1000

## 2013-04-16 MED ORDER — FLEET ENEMA 7-19 GM/118ML RE ENEM: 1.0000 | ENEMA | RECTAL | Status: DC | PRN

## 2013-04-16 MED ORDER — DIPHENHYDRAMINE HCL 25 MG PO CAPS: 25.0000 mg | ORAL_CAPSULE | Freq: Four times a day (QID) | ORAL | Status: DC | PRN

## 2013-04-16 MED ORDER — ONDANSETRON HCL 4 MG/2ML IJ SOLN: 4.0000 mg | INTRAMUSCULAR | Status: DC | PRN

## 2013-04-16 MED ORDER — LANOLIN HYDROUS EX OINT: TOPICAL_OINTMENT | CUTANEOUS | Status: DC | PRN

## 2013-04-16 MED ORDER — OXYTOCIN BOLUS FROM INFUSION
500.0000 mL | INTRAVENOUS | Status: DC
  Administered 2013-04-16: 500 mL via INTRAVENOUS

## 2013-04-16 MED ORDER — PRENATAL MULTIVITAMIN CH
1.0000 | ORAL_TABLET | Freq: Every day | ORAL | Status: DC
  Administered 2013-04-16 – 2013-04-17 (×2): 1 via ORAL
  Filled 2013-04-16 (×2): qty 1

## 2013-04-16 MED ORDER — LACTATED RINGERS IV SOLN
500.0000 mL | INTRAVENOUS | Status: DC | PRN
Start: 2013-04-16 — End: 2013-04-16

## 2013-04-16 MED ORDER — ACETAMINOPHEN 325 MG PO TABS: 650.0000 mg | ORAL_TABLET | ORAL | Status: DC | PRN

## 2013-04-16 MED ORDER — IBUPROFEN 600 MG PO TABS: 600.0000 mg | ORAL_TABLET | Freq: Four times a day (QID) | ORAL | Status: DC | PRN

## 2013-04-16 MED ORDER — ZOLPIDEM TARTRATE 5 MG PO TABS
5.0000 mg | ORAL_TABLET | Freq: Every evening | ORAL | Status: DC | PRN
Start: 2013-04-16 — End: 2013-04-17

## 2013-04-16 NOTE — H&P (Signed)
This is Dr. Francoise CeoBernard Abrish Erny dictating the history and physical on  Alisha Hall she's a 33 year old gravida 4 para 03/18/2001 at 40 weeks and 2 [facility] G[facility] 4 cm 100% vertex -1 is now 8 cm 100% vertex -1 amniotomy performed the fluids clear Past medical history negative Past surgical history negative System review negative Social history negative Physical exam well-developed female in labor HEENT negative Lungs clear to P&A Breasts negative Heart regular rhythm no murmurs no gallops Abdomen term Pelvic as described above extremities negative

## 2013-04-16 NOTE — MAU Note (Signed)
C/o vaginal bleeding and ucs since 0200 this AM;

## 2013-04-16 NOTE — Lactation Note (Signed)
This note was copied from the chart of Boy Brynnlee Paniagua-Valencia. Lactation Consultation Note Initial visit at 3 hours of age.  Spanish interpreter assisted with visit.  [facility] given and discussed.  Mom returns demonstration with hand expression and large amount of colostrum visible.  Mom is aware of early feeding cues and skin to skin benefits.  Baby showing feeding cues.  Instructed mom on cross cradle hold although baby latched himself quickly when close to breast in cradle hold.  Rolled lip out and observed several strong suckling bursts.  Mom to call for assist as needed.    Patient Name: Boy Rometta Emeryna Paniagua-Valencia ZOXWR'UToday's Date: 04/16/2013 Reason for consult: Initial assessment   Maternal Data Has patient been taught Hand Expression?: Yes Does the patient have breastfeeding experience prior to this delivery?: Yes  Feeding Feeding Type: Breast Fed Length of feed: 3 min  LATCH Score/Interventions Latch: Grasps breast easily, tongue down, lips flanged, rhythmical sucking.  Audible Swallowing: A few with stimulation Intervention(s): Skin to skin;Hand expression;Alternate breast massage  Type of Nipple: Everted at rest and after stimulation  Comfort (Breast/Nipple): Soft / non-tender     Hold (Positioning): No assistance needed to correctly position infant at breast.  LATCH Score: 9  Lactation Tools Discussed/Used     Consult Status Consult Status: Complete    Shamere Campas, Arvella MerlesJana Lynn 04/16/2013, 5:36 PM

## 2013-04-17 LAB — CBC
HEMATOCRIT: 32.4 % — AB (ref 36.0–46.0)
HEMOGLOBIN: 10.9 g/dL — AB (ref 12.0–15.0)
MCH: 28.7 pg (ref 26.0–34.0)
MCHC: 33.6 g/dL (ref 30.0–36.0)
MCV: 85.3 fL (ref 78.0–100.0)
Platelets: 155 10*3/uL (ref 150–400)
RBC: 3.8 MIL/uL — AB (ref 3.87–5.11)
RDW: 14.5 % (ref 11.5–15.5)
WBC: 12.7 10*3/uL — AB (ref 4.0–10.5)

## 2013-04-17 NOTE — Progress Notes (Signed)
UR chart review completed.  

## 2013-04-17 NOTE — Lactation Note (Signed)
This note was copied from the chart of Alisha Alixandrea Hall. Lactation Consultation Note  Patient Name: Alisha Hall ZOXWR'UToday's Date: 04/17/2013 Reason for consult: Follow-up assessment Eda (Research officer, trade unionpanish Interpreter) present for visit. Mom reports baby is nursing well, denies questions or concerns. Advised of OP services and support group. Engorgement care reviewed if needed.   Maternal Data    Feeding Feeding Type: Breast Fed Length of feed: 30 min  LATCH Score/Interventions Latch: Grasps breast easily, tongue down, lips flanged, rhythmical sucking.  Audible Swallowing: A few with stimulation  Type of Nipple: Everted at rest and after stimulation  Comfort (Breast/Nipple): Soft / non-tender     Hold (Positioning): No assistance needed to correctly position infant at breast.  LATCH Score: 9  Lactation Tools Discussed/Used     Consult Status Consult Status: Complete    Alfred LevinsGranger, Loran Auguste Ann 04/17/2013, 1:22 PM

## 2013-04-17 NOTE — Discharge Summary (Signed)
Obstetric Discharge Summary Reason for Admission: induction of labor Prenatal Procedures: none Intrapartum Procedures: spontaneous vaginal delivery Postpartum Procedures: none Complications-Operative and Postpartum: none Hemoglobin  Date Value Ref Range Status  04/17/2013 10.9* 12.0 - 15.0 g/dL Final  1/61/09608/30/2014 45.413.2  12.2 - 16.2 g/dL Final     HCT  Date Value Ref Range Status  04/17/2013 32.4* 36.0 - 46.0 % Final     HCT, POC  Date Value Ref Range Status  10/14/2012 40.7  37.7 - 47.9 % Final    Physical Exam:  General: alert Lochia: appropriate Uterine Fundus: firm Incision: healing well DVT Evaluation: No evidence of DVT seen on physical exam.  Discharge Diagnoses: Term Pregnancy-delivered  Discharge Information: Date: 04/17/2013 Activity: pelvic rest Diet: routine Medications: Percocet Condition: stable Instructions: refer to practice specific booklet Discharge to: home Follow-up Information   Follow up with Kathreen CosierMARSHALL,Tyon Cerasoli A, MD.   Specialty:  Obstetrics and Gynecology   Contact information:   [address] [place] [id number] [phone]       Newborn Data: Live born female  Birth Weight: 8 lb 0.4 oz (3640 g) APGAR: 9, 9  Home with mother.  Shaniqua Guillot A 04/17/2013, 7:21 AM

## 2013-04-17 NOTE — Discharge Instructions (Signed)
Discharge instructions   You can wash your hair  Shower  Eat what you want  Drink what you want  See me in 6 weeks  Your ankles are going to swell more in the next 2 weeks than when pregnant  No sex for 6 weeks   Annleigh Knueppel A, MD 04/17/2013

## 2013-04-24 ENCOUNTER — Inpatient Hospital Stay (HOSPITAL_COMMUNITY)
Admission: AD | Admit: 2013-04-24 | Discharge: 2013-04-24 | Disposition: A | Discharge status: Home / Self Care | Payer: MEDICAID | Source: Ambulatory Visit | Attending: Obstetrics | Admitting: Obstetrics

## 2013-04-24 ENCOUNTER — Encounter (HOSPITAL_COMMUNITY): Payer: Self-pay | Admitting: *Deleted

## 2013-04-24 DIAGNOSIS — M7989 Other specified soft tissue disorders: Secondary | ICD-10-CM

## 2013-04-24 DIAGNOSIS — O870 Superficial thrombophlebitis in the puerperium: Secondary | ICD-10-CM | POA: Insufficient documentation

## 2013-04-24 DIAGNOSIS — M79609 Pain in unspecified limb: Secondary | ICD-10-CM

## 2013-04-24 MED ORDER — CEPHALEXIN 500 MG PO CAPS: 500.0000 mg | ORAL_CAPSULE | Freq: Four times a day (QID) | ORAL | Status: DC

## 2013-04-24 MED ORDER — IBUPROFEN 600 MG PO TABS: 600.0000 mg | ORAL_TABLET | Freq: Four times a day (QID) | ORAL | Status: DC | PRN

## 2013-04-24 MED ORDER — CEPHALEXIN 500 MG PO CAPS
500.0000 mg | ORAL_CAPSULE | Freq: Four times a day (QID) | ORAL | Status: DC
  Administered 2013-04-24: 500 mg via ORAL
  Filled 2013-04-24 (×4): qty 1

## 2013-04-24 NOTE — Progress Notes (Signed)
Right lower extremity venous duplex completed.  Right:  No evidence of DVT or Baker's cyst.  There appears to be superficial thrombus in the right greater saphenous vein in the distal thigh.  Left:  Negative for DVT in the common femoral vein.

## 2013-04-24 NOTE — MAU Note (Signed)
Pt has a a reddened area on her inner right thigh. Pt states she noticed it 04/17/2013 a day after the delivery and area has gotten larger

## 2013-04-24 NOTE — Progress Notes (Signed)
Pain is a 4-5 but pain also goes as high as an 8 when she has been walking or when she gets out of bed or changes position

## 2013-04-24 NOTE — MAU Note (Signed)
There is an area on the back of the right leg just above the knee that is warm, tender to touch and pink about 6 cm x 8 cm.

## 2013-04-24 NOTE — MAU Provider Note (Signed)
  History     CSN: [id number]  Arrival date and time: 04/24/13 1418   None     Chief Complaint  Patient presents with  . Leg Pain   HPI  33 yo [id number] who presents 8 days postpartum after SVD of baby boy with Dr. Gaynell FaceMarshall for concern of a knot on her right leg.   States that the day after delivery, she noticed a firm hard area on the inside of her right thigh just proximal to her knee.  Says over the last week the area has grown and now has surrounding redness. No drainage. No hx of injury.    No fevers, nausea, vomiting, headaches, chest pain, SOB.   + chills  OB History   Grav Para Term Preterm Abortions TAB SAB Ect Mult Living   4 4 3 1      4       Past Medical History  Diagnosis Date  . Medical history non-contributory     Past Surgical History  Procedure Laterality Date  . No past surgeries      Family History  Problem Relation Age of Onset  . Cancer Maternal Grandfather     History  Substance Use Topics  . Smoking status: Never Smoker   . Smokeless tobacco: Not on file  . Alcohol Use: No    Allergies: No Known Allergies  No prescriptions prior to admission    ROS- see above.  Physical Exam   Blood pressure 106/64, pulse 69, temperature 98.3 F (36.8 C), temperature source Oral, resp. rate 16, unknown if currently breastfeeding.  Physical Exam  Vitals reviewed. Constitutional: She is oriented to person, place, and time. She appears well-developed and well-nourished.  HENT:  Head: Normocephalic.  Eyes: EOM are normal.  Neck: Neck supple.  Cardiovascular: Normal rate, regular rhythm and normal heart sounds.   Respiratory: Effort normal and breath sounds normal.  GI: Soft. Bowel sounds are normal. She exhibits no distension. There is no tenderness. There is no rebound.  Uterus firm and well below the umbilicus.  Musculoskeletal: Normal range of motion.  Neurological: She is alert and oriented to person, place, and time.  Skin:       MAU  Course  Procedures   RLE venous duplex- negative per the tech read but states exam was consistent with superficial thrombophlebitis as already suspected.    MDM Exam most consistent with superficial thrombophlebitis although given area of erythema and warmth, I am slightly concerned for a possible overlying cellulitis also.    Assessment and Plan   - RLE duplex obtained and negative for deep venous thrombosis on prelim read - rx of keflex for cellulitis - ibuprofen and heat for superficial thrombophlebitis - f/u next week in Dr. Elsie StainMarshall's office if no improvement.    D/c to home.     Anasha Perfecto L 04/24/2013, 3:07 PM

## 2013-04-24 NOTE — MAU Note (Signed)
Patient states she had a vaginal deliver ion 3-2. States on the day after discharge she noticed a lump on the back of her right knee on the upper side. States it has gotten worse and is painful.

## 2013-04-24 NOTE — Discharge Instructions (Signed)
Flebitis (Phlebitis) La flebitis es el dolor y la hinchazn (inflamacin) de una vena. Ocurre en los brazos, las piernas o el torso (tronco), como tambin en el interior del [place]. Normalmente, la flebitis no es grave cuando ocurre cerca de la superficie del cuerpo. No obstante, puede causar problemas graves cuando ocurre en una vena en el interior del [place]. CAUSAS  Existen varios factores que pueden desencadenar la flebitis, incluidos los siguientes:   Flujo sanguneo reducido en las venas. Esto puede suceder debido a lo siguiente:  Reposo en cama por un largo perodo.  Viajes de Lexicographer.  Traumatismos.  Ciruga.  Sobrepeso (obesidad) o Psychiatrist.  Va intravenosa (IV) en la vena a travs de la cual se reciben ciertos medicamentos.  Cncer o tratamiento para el cncer.  Uso de drogas ilegales que se administran por la vena.  Enfermedades inflamatorias.  Enfermedades hereditarias (genticas) que aumentan el riesgo de sufrir cogulos sanguneos.  Tratamientos hormonales, como las pldoras anticonceptivas. SIGNOS Y SNTOMAS   Zona roja, sensible, hinchada y dolorosa en la piel. Normalmente, la zona es larga y Dietitian.  Dureza a lo largo del centro de la zona afectada. Esto puede indicar que se ha formado un cogulo sanguneo.  Fiebre no muy elevada. DIAGNSTICO  Un mdico normalmente puede diagnosticar la flebitis al examinar la zona afectada y hacerle preguntas sobre sus sntomas. Para detectar la presencia de alguna infeccin o de cogulos sanguneos, el mdico puede indicarle anlisis de sangre o una ecografa de la zona. Los anlisis de [place] y sus antecedentes familiares tambin pueden indicar la presencia de una enfermedad gentica preexistente que provoca cogulos sanguneos. Ocasionalmente, se extrae del cuerpo una pieza de tejido (muestra para la biopsia) si se sospecha una causa inusual de la flebitis. TRATAMIENTO  El tratamiento depender de la  gravedad del [place] y de la zona del cuerpo afectada. El tratamiento puede incluir:  Compresas calientes o una almohadilla trmica.  Medias de compresin o vendajes.  Medicamentos antiinflamatorios.  Remocin de la va intravenosa (IV) que pueda estar provocando el problema.  Medicamentos que [employer] grmenes (antibiticos) si hay infeccin.  Anticoagulantes si hay un cogulo sanguneo o se sospecha la presencia de uno.  En casos poco frecuentes, es posible que se necesite ciruga para eliminar las secciones daadas de la vena. INSTRUCCIONES PARA EL CUIDADO EN EL HOGAR   Tome solo medicamentos de venta libre o recetados, segn las indicaciones del mdico. Tome todos los medicamentos exactamente como se los recetaron.  Levante (eleve) la zona afectada por encima del nivel del corazn, segn las indicaciones del mdico.  Aplique una compresa caliente o una almohadilla trmica en la zona afectada, segn las indicaciones del mdico. No duerma con la almohadilla trmica.  Use medias de compresin o vendajes segn las indicaciones. Estos acelerarn la curacin y evitarn que el trastorno regrese.  Si est tomando anticoagulantes, tenga en cuenta lo siguiente:  Hgase anlisis de sangre de seguimiento segn las indicaciones del mdico.  Consulte con el mdico antes de tomar algn medicamento nuevo.  Lleve una tarjeta de alerta mdica o use su brazalete de alerta mdica para demostrar que est tomando anticoagulantes.  En el caso de flebitis en las piernas, haga lo siguiente:  Advertising account planner largos perodos de pie o en la cama.  Mantenga las piernas en movimiento. Eleve las piernas cuando est sentado o recostado.  No fume.  Las mujeres, especialmente las que tienen ms de 35aos, deben [employer] y los beneficios de tomar  pldoras anticonceptivas. Este tipo de tratamiento hormonal puede aumentar el riesgo de sufrir cogulos sanguneos.  Concurra a las consultas de  control con su mdico segn las indicaciones. SOLICITE ATENCIN MDICA SI:   Nota hematomas inusuales o tiene hemorragias.  La hinchazn o el dolor en la zona afectada no mejoran.  Est tomando antiinflamatorios y comienza a Psychiatristsentir dolor en el vientre (abdominal). SOLICITE ATENCIN MDICA DE INMEDIATO SI:   Comienza a sentir sbitamente dolor en el pecho o dificultad para respirar.  Tiene fiebre o sntomas persistentes durante ms de 2a 3das.  Tiene fiebre y los sntomas empeoran repentinamente. ASEGRESE DE QUE:  Comprende estas instrucciones.  Controlar su afeccin.  Recibir ayuda de inmediato si no mejora o si empeora. Document Released: 11/11/2004 Document Revised: 11/22/2012 [facility] Patient Information 2014 [place], [place].

## 2013-12-17 ENCOUNTER — Encounter (HOSPITAL_COMMUNITY): Payer: Self-pay | Admitting: *Deleted

## 2014-06-16 ENCOUNTER — Ambulatory Visit (INDEPENDENT_AMBULATORY_CARE_PROVIDER_SITE_OTHER): Payer: Self-pay | Admitting: Physician Assistant

## 2014-06-16 VITALS — BP 120/80 | HR 88 | Temp 98.7°F | Resp 16 | Ht 61.0 in | Wt 166.0 lb

## 2014-06-16 DIAGNOSIS — M6248 Contracture of muscle, other site: Secondary | ICD-10-CM

## 2014-06-16 DIAGNOSIS — M62838 Other muscle spasm: Secondary | ICD-10-CM

## 2014-06-16 DIAGNOSIS — J029 Acute pharyngitis, unspecified: Secondary | ICD-10-CM

## 2014-06-16 DIAGNOSIS — R52 Pain, unspecified: Secondary | ICD-10-CM

## 2014-06-16 DIAGNOSIS — G44219 Episodic tension-type headache, not intractable: Secondary | ICD-10-CM

## 2014-06-16 LAB — POCT RAPID STREP A (OFFICE): Rapid Strep A Screen: NEGATIVE

## 2014-06-16 MED ORDER — CYCLOBENZAPRINE HCL 5 MG PO TABS: 5.0000 mg | ORAL_TABLET | Freq: Three times a day (TID) | ORAL | Status: DC | PRN

## 2014-06-16 NOTE — Progress Notes (Signed)
   Subjective:    Patient ID: Alisha Hall, female    DOB: 1980/07/23, 34 y.o.   MRN: [id number]  Chief Complaint  Patient presents with  . Headache    started on friday  . Sore Throat    started on friday--hard to talk  . Generalized Body Aches    started on friday   Medications, allergies, past medical history, surgical history, family history, social history and problem list reviewed and updated.  HPI  [age] yof with no significant pmh presents with 3 day h/o st, ha, and generalized body aches.   Sx started 3 days ago with sore throat, trouble swallowing. Sx have persisted. Able to handle own secretions, no change in voice. Has had mild non prod cough past couple days. Mild generalized body aches past couple days. Denies abd pain, n/v, diarrhea. Denies sob, cp. Denies fevers, chills.   Persistent HA past couple days. Feels that pain starts at base of neck bilaterally and radiates to front of head. Assoc with moderate bilateral HA. Gradual onset. Worse with certain head movements. Denies vision changes.   Review of Systems See HPI.     Objective:   Physical Exam  Constitutional: She appears well-developed and well-nourished.  Non-toxic appearance. She does not have a sickly appearance. She does not appear ill. No distress.  BP 120/80 mmHg  Pulse 88  Temp(Src) 98.7 F (37.1 C) (Oral)  Resp 16  Ht 5\' 1"  (1.549 m)  Wt 166 lb (75.297 kg)  BMI 31.38 kg/m2  SpO2 97%  LMP 05/21/2014   HENT:  Right Ear: Tympanic membrane normal.  Left Ear: Tympanic membrane normal.  Nose: Right sinus exhibits no maxillary sinus tenderness and no frontal sinus tenderness. Left sinus exhibits no maxillary sinus tenderness and no frontal sinus tenderness.  Mouth/Throat: Uvula is midline and mucous membranes are normal. Posterior oropharyngeal erythema present. No oropharyngeal exudate, posterior oropharyngeal edema or tonsillar abscesses.  Mild erythema right posterior pharynx, right  tonsil.   Neck: No Brudzinski's sign noted.  Pulmonary/Chest: Effort normal and breath sounds normal. She has no decreased breath sounds. She has no wheezes. She has no rhonchi. She has no rales.  Lymphadenopathy:       Head (right side): No submental, no submandibular and no tonsillar adenopathy present.       Head (left side): No submental, no submandibular and no tonsillar adenopathy present.    She has no cervical adenopathy.   Results for orders placed or performed in visit on 06/16/14  POCT rapid strep A  Result Value Ref Range   Rapid Strep A Screen Negative Negative      Assessment & Plan:   [age] yof with no significant pmh presents with 3 day h/o st, ha, and generalized body aches.   Sore throat - Plan: POCT rapid strep A --0/4 centor criteria, pt requested strep swab --> neg --likely viral, tylenol prn, lozenges, hydration, gargles --rtc 4-5 days if no improvement  Neck muscle spasm Episodic tension-type headache, not intractable --doubt sah with gradual onset, not worst of life --doubt meningitis with no fevers, chills, nuchal rigidity --most likely tension ha, cervical spine spasms causing HA --heat, massage, light rom, flexeril tid prn  Donnajean Lopesodd M. Aylinn Rydberg, PA-C Physician Assistant-Certified [facility]  06/16/2014 12:10 PM

## 2014-06-16 NOTE — Patient Instructions (Signed)
Your strep swab was negative, you don't have strep throat.  Your sore throat is most likely from a viral upper respiratory infection. I don't think you have sinusitis at this time as you don't have a fever, you don't have a ton of nasal drainage, and you aren't overly tender when I push on your sinuses.  Please take tylenol as needed, warm salt water goggles, lozenges, and staying hydrated will all help.  Your have muscle spasms along your neck that are likely causing your headaches.  Applying heat, massage, light range of motion, and taking the flexeril up to every 8 hours as needed should help with the spasm. Please let us know if you're not feeling better in 4-5 days.

## 2015-01-30 ENCOUNTER — Ambulatory Visit (INDEPENDENT_AMBULATORY_CARE_PROVIDER_SITE_OTHER): Payer: Self-pay | Admitting: Family Medicine

## 2015-01-30 VITALS — BP 116/80 | HR 102 | Temp 98.2°F | Resp 17 | Ht 62.0 in | Wt 149.8 lb

## 2015-01-30 DIAGNOSIS — B349 Viral infection, unspecified: Secondary | ICD-10-CM

## 2015-01-30 DIAGNOSIS — R519 Headache, unspecified: Secondary | ICD-10-CM

## 2015-01-30 DIAGNOSIS — M542 Cervicalgia: Secondary | ICD-10-CM

## 2015-01-30 DIAGNOSIS — R51 Headache: Secondary | ICD-10-CM

## 2015-01-30 DIAGNOSIS — J069 Acute upper respiratory infection, unspecified: Secondary | ICD-10-CM

## 2015-01-30 MED ORDER — IPRATROPIUM BROMIDE 0.03 % NA SOLN: 2.0000 | Freq: Two times a day (BID) | NASAL | Status: DC

## 2015-01-30 MED ORDER — NAPROXEN 500 MG PO TABS: ORAL_TABLET | ORAL | Status: DC

## 2015-01-30 NOTE — Progress Notes (Signed)
Patient ID: Alisha Hall, female    DOB: 1981-02-12  Age: 34 y.o. MRN: [id number]  Chief Complaint  Patient presents with  . Headache    shoulder, neck pain   . Sore Throat  . Sinusitis    Subjective:   Patient is been ill for about 2 days with headache, pain in the back of her neck, sore throat, and sinus congestion. Not coughing. No ear pain. Been febrile. Does not work a job. Has 4 children.  Current allergies, medications, problem list, past/family and social histories reviewed.  Objective:  BP 116/80 mmHg  Pulse 102  Temp(Src) 98.2 F (36.8 C) (Oral)  Resp 17  Ht 5\' 2"  (1.575 m)  Wt 149 lb 12.8 oz (67.949 kg)  BMI 27.39 kg/m2  SpO2 99%  LMP 12/30/2014  No acute distress. TMs normal. Throat not erythematous. Neck supple without significant nodes. Chest clear. Heart regular without murmurs. No tenderness to percussion of the sinuses. She has a little tender in the back of her neck.  Assessment & Plan:   Assessment: No diagnosis found.    Plan: Treat symptomatically. See instructions. She still is nursing.  There are no Patient Instructions on file for this visit.   No Follow-up on file.   Keitha Kolk, MD 01/30/2015

## 2015-01-30 NOTE — Patient Instructions (Addendum)
Take naproxen 500 mg one twice daily for neck and hip pain  Use the ipratropium nose spray 2 sprays each nostril every 6 hours as needed for head congestion  You can also take acetaminophen 500 mg 2 pills 3 times daily if needed for additional head and neck pain relief  You can also take over-the-counter Claritin-D or Allegra-D (loratadine D or fexofenadine D) one dose daily if needed for worse head congestion  Return if not improving  Infecciones virales (Viral Infections) La causa de las infecciones virales son diferentes tipos de virus.La mayora de las infecciones virales no son graves y se curan solas. Sin embargo, algunas infecciones pueden provocar sntomas graves y causar complicaciones.  SNTOMAS Las infecciones virales ocasionan:   Dolores de Advertising copywritergarganta.  Molestias.  Dolor de Turkmenistancabeza.  Mucosidad nasal.  Diferentes tipos de erupcin.  Lagrimeo.  Cansancio.  Tos.  Prdida del apetito.  Infecciones gastrointestinales que producen nuseas, vmitos y Guineadiarrea. Estos sntomas no responden a los antibiticos porque la infeccin no es por bacterias. Sin embargo, puede sufrir una infeccin bacteriana luego de la infeccin viral. Se denomina sobreinfeccin. Los sntomas de esta infeccin bacteriana son:   Jefferson Fuelmpeora el dolor en la garganta con pus y dificultad para tragar.  Ganglios hinchados en el cuello.  Escalofros y fiebre muy elevada o persistente.  Dolor de cabeza intenso.  Sensibilidad en los senos paranasales.  Malestar (sentirse enfermo) general persistente, dolores musculares y fatiga (cansancio).  Tos persistente.  Produccin mucosa con la tos, de color amarillo, verde o marrn. INSTRUCCIONES PARA EL CUIDADO DOMICILIARIO  Solo tome medicamentos que se pueden comprar sin receta o recetados para Chief Technology Officerel dolor, Dentistmalestar, la diarrea o la fiebre, como le indica el mdico.  Beba gran cantidad de lquido para mantener la orina de tono claro o color amarillo plido.  Las bebidas deportivas proporcionan electrolitos,azcares e hidratacin.  Descanse lo suficiente y Abbott Laboratoriesalimntese bien. Puede tomar sopas y caldos con crackers o arroz. SOLICITE ATENCIN MDICA DE INMEDIATO SI:  Tiene dolor de cabeza, le falta el aire, siente dolor en el pecho, en el cuello o aparece una erupcin.  Tiene vmitos o diarrea intensos y no puede retener lquidos.  Usted o su nio tienen una temperatura oral de ms de 38,9 C (102 F) y no puede controlarla con medicamentos.  Su beb tiene ms de 3 meses y su temperatura rectal es de 102 F (38.9 C) o ms.  Su beb tiene 3 meses o menos y su temperatura rectal es de 100.4 F (38 C) o ms. EST SEGURO QUE:   Comprende las instrucciones para el alta mdica.  Controlar su enfermedad.  Solicitar atencin mdica de inmediato segn las indicaciones.   Esta informacin no tiene Theme park managercomo fin reemplazar el consejo del mdico. Asegrese de hacerle al mdico cualquier pregunta que tenga.   Document Released: 11/11/2004 Document Revised: 04/26/2011 Elsevier Interactive Patient Education [employer]2016 Elsevier Inc.

## 2015-03-11 ENCOUNTER — Ambulatory Visit (INDEPENDENT_AMBULATORY_CARE_PROVIDER_SITE_OTHER): Payer: Self-pay | Admitting: Family Medicine

## 2015-03-11 ENCOUNTER — Ambulatory Visit (INDEPENDENT_AMBULATORY_CARE_PROVIDER_SITE_OTHER): Payer: Self-pay

## 2015-03-11 VITALS — BP 120/69 | HR 82 | Temp 98.3°F | Resp 20 | Ht 61.5 in | Wt 153.4 lb

## 2015-03-11 DIAGNOSIS — K5901 Slow transit constipation: Secondary | ICD-10-CM

## 2015-03-11 DIAGNOSIS — R1031 Right lower quadrant pain: Secondary | ICD-10-CM

## 2015-03-11 DIAGNOSIS — G8929 Other chronic pain: Secondary | ICD-10-CM

## 2015-03-11 NOTE — Progress Notes (Signed)
By signing my name below, I, Stann Ore, attest that this documentation has been prepared under the direction and in the presence of Elvina Sidle, MD. Electronically Signed: Stann Ore, Scribe. 03/11/2015 , 6:58 PM .  Patient was seen in room 1 .   Patient ID: Alisha Hall MRN: [id number], DOB: 1981-02-03, 35 y.o. Date of Encounter: 03/11/2015  Primary Physician: No primary care provider on file.  Chief Complaint:  Chief Complaint  Patient presents with   OTHER    Poss.  Appendix    HPI:  Alisha Hall is a 35 y.o. female who presents to [facility] complaining of RLQ abdominal pain that started 3 weeks ago. It got worse 2 days ago. She denies diarrhea, nausea, vomiting.   Past Medical History  Diagnosis Date   Medical history non-contributory      Home Meds: Prior to Admission medications   Medication Sig Start Date End Date Taking? Authorizing Provider  acetaminophen (TYLENOL) 500 MG tablet Take 500 mg by mouth daily as needed for mild pain.   Yes Historical Provider, MD  ibuprofen (ADVIL,MOTRIN) 600 MG tablet Take 1 tablet (600 mg total) by mouth every 6 (six) hours as needed. 04/24/13  Yes Vale Haven, MD  cyclobenzaprine (FLEXERIL) 5 MG tablet Take 1 tablet (5 mg total) by mouth 3 (three) times daily as needed for muscle spasms. Patient not taking: Reported on 03/11/2015 06/16/14   Raelyn Ensign, PA  ipratropium (ATROVENT) 0.03 % nasal spray Place 2 sprays into both nostrils 2 (two) times daily. Patient not taking: Reported on 03/11/2015 01/30/15   Peyton Najjar, MD  naproxen (NAPROSYN) 500 MG tablet Take one twice daily with breakfast and supper for neck and head pain Patient not taking: Reported on 03/11/2015 01/30/15   Peyton Najjar, MD  OVER THE COUNTER MEDICATION 1 tablet 2 (two) times daily. Reported on 03/11/2015    Historical Provider, MD    Allergies: No Known Allergies  Social History   Social History     Marital Status: Single    Spouse Name: N/A   Number of Children: N/A   Years of Education: N/A   Occupational History   Not on file.   Social History Main Topics   Smoking status: Never Smoker    Smokeless tobacco: Not on file   Alcohol Use: No   Drug Use: No   Sexual Activity: Yes    Copy: None   Other Topics Concern   Not on file   Social History Narrative     Review of Systems: Constitutional: negative for fever, chills, night sweats, weight changes, or fatigue  HEENT: negative for vision changes, hearing loss, congestion, rhinorrhea, ST, epistaxis, or sinus pressure Cardiovascular: negative for chest pain or palpitations Respiratory: negative for hemoptysis, wheezing, shortness of breath, or cough Abdominal: negative for nausea, vomiting, diarrhea, or constipation; positive for abd pain (RLQ) Dermatological: negative for rash Neurologic: negative for headache, dizziness, or syncope All other systems reviewed and are otherwise negative with the exception to those above and in the HPI.  Physical Exam: Blood pressure 120/69, pulse 82, temperature 98.3 F (36.8 C), temperature source Oral, resp. rate 20, height 5' 1.5" (1.562 m), weight 153 lb 6.4 oz (69.582 kg), last menstrual period 02/14/2015, SpO2 98 %, not currently breastfeeding., Body mass index is 28.52 kg/(m^2). General: Well developed, well nourished, in no acute distress. Head: Normocephalic, atraumatic, eyes without discharge, sclera non-icteric, nares are without discharge. Bilateral  auditory canals clear, TM's are without perforation, pearly grey and translucent with reflective cone of light bilaterally. Oral cavity moist, posterior pharynx without exudate, erythema, peritonsillar abscess, or post nasal drip.  Neck: Supple. No thyromegaly. Full ROM. No lymphadenopathy. Lungs: Clear bilaterally to auscultation without wheezes, rales, or rhonchi. Breathing is unlabored. Heart: RRR  with S1 S2. No murmurs, rubs, or gallops appreciated. Abdomen: Soft, non-tender, non-distended with normoactive bowel sounds. No hepatomegaly. No obvious abdominal masses. Very tender in RLQ with deep palpation, No rebound/guarding Msk:  Strength and tone normal for age. Extremities/Skin: Warm and dry. No clubbing or cyanosis. No edema. No rashes or suspicious lesions. Neuro: Alert and oriented X 3. Moves all extremities spontaneously. Gait is normal. CNII-XII grossly in tact. Psych:  Responds to questions appropriately with a normal affect.   [facility] reading (PRIMARY) by Dr. Milus Glazier : pelvic xray: IUD in place, heavy stool burden in right colon   ASSESSMENT AND PLAN:  35 y.o. year old female with  This chart was scribed in my presence and reviewed by me personally.    ICD-9-CM ICD-10-CM   1. Abdominal pain, chronic, right lower quadrant 789.03 R10.31 DG Abd 1 View   338.29 G89.29   2. Slow transit constipation 564.01 K59.01     Use Miralax and increase fluids    Signed, Elvina Sidle, MD 03/11/2015 6:58 PM

## 2015-03-11 NOTE — Patient Instructions (Signed)
Necesita tomar  Miralax: 2 copas en agua 2 veces al dia para 3 dias.

## 2015-05-23 ENCOUNTER — Ambulatory Visit (INDEPENDENT_AMBULATORY_CARE_PROVIDER_SITE_OTHER): Payer: Self-pay | Admitting: Family Medicine

## 2015-05-23 VITALS — BP 128/80 | HR 85 | Temp 98.0°F | Resp 17 | Ht 61.5 in | Wt 147.0 lb

## 2015-05-23 DIAGNOSIS — L739 Follicular disorder, unspecified: Secondary | ICD-10-CM

## 2015-05-23 DIAGNOSIS — J309 Allergic rhinitis, unspecified: Secondary | ICD-10-CM

## 2015-05-23 DIAGNOSIS — M79604 Pain in right leg: Secondary | ICD-10-CM

## 2015-05-23 DIAGNOSIS — M5412 Radiculopathy, cervical region: Secondary | ICD-10-CM

## 2015-05-23 DIAGNOSIS — Z789 Other specified health status: Secondary | ICD-10-CM

## 2015-05-23 DIAGNOSIS — L03115 Cellulitis of right lower limb: Secondary | ICD-10-CM

## 2015-05-23 DIAGNOSIS — R0981 Nasal congestion: Secondary | ICD-10-CM

## 2015-05-23 DIAGNOSIS — M5431 Sciatica, right side: Secondary | ICD-10-CM

## 2015-05-23 MED ORDER — CYCLOBENZAPRINE HCL 5 MG PO TABS: ORAL_TABLET | ORAL | Status: DC

## 2015-05-23 MED ORDER — DOXYCYCLINE HYCLATE 100 MG PO TABS: 100.0000 mg | ORAL_TABLET | Freq: Two times a day (BID) | ORAL | Status: DC

## 2015-05-23 MED ORDER — MELOXICAM 7.5 MG PO TABS: 7.5000 mg | ORAL_TABLET | Freq: Every day | ORAL | Status: DC | PRN

## 2015-05-23 NOTE — Patient Instructions (Addendum)
IF you received an x-ray today, you will receive an invoice from [facility]. Please contact [facility] at [phone] with questions or concerns regarding your invoice.   IF you received labwork today, you will receive an invoice from [facility]. Please contact [facility] at [phone] with questions or concerns regarding your invoice.   Our billing staff will not be able to assist you with questions regarding bills from these companies.  You will be contacted with the lab results as soon as they are available. The fastest way to get your results is to activate your My Chart account. Instructions are located on the last page of this paperwork. If you have not heard from us regarding the results in 2 weeks, please contact this office.    toalla calor o bano calor de area enchado cerca de vagina 4-5 veces cada dia, y antibiotico. regrese si mas grande.   continuarse medicina por alergias.   Ibuprofen durnate dia y cyclobenzaprine si necesario en la noche por ombro y pierna, pero regrese en proximo semana si no esta mejor.  Mas temorano si empeorse.   Celulitis (Cellulitis)  La celulitis es una infeccin de la piel y del tejido que se encuentra debajo de la piel. El rea infectada generalmente est de color rojo y duele. Ocurre con ms frecuencia en los brazos y en las piernas. CUIDADOS EN EL HOGAR   Tome los antibiticos tal como se le indic. Finalice el Maunaloamedicamento, aunque comience a Actorsentirse mejor.  Mantenga el brazo o la pierna infectada elevada.  Aplique paos calientes en la zona hasta 4 veces al da.  Tome slo los medicamentos que le haya indicado el mdico.  Cumpla con los controles mdicos segn las indicaciones. SOLICITE AYUDA SI:  Observa una lnea roja en la piel que sale desde la herida.  El rea roja se extiende o se vuelve de color oscuro.  El hueso o la articulacin que se encuentran por debajo de la zona  infectada le duelen despus de que la piel se [place]ub[place].  La infeccin se repite en la misma zona o en una zona diferente.  Tiene un bulto inflamado (hinchado) en la zona infectada.  Aparecen nuevos sntomas.  Tiene fiebre. SOLICITE AYUDA DE INMEDIATO SI:   Se siente muy somnoliento.  Vomita o tiene diarrea.  Se siente enfermo y tiene Smith Internationaldolores musculares.   Esta informacin no tiene Theme park managercomo fin reemplazar el consejo del mdico. Asegrese de hacerle al mdico cualquier pregunta que tenga.   Document Released: 07/22/2009 Document Revised: 10/23/2014 Elsevier Interactive Patient Education 2016 Elsevier Inc.  Radiculopata cervical (Cervical Radiculopathy) La radiculopata cervical se presenta cuando un nervio del cuello (nervio cervical) est comprimido o daado. Esta afeccin puede producirse debido a una lesin o como parte del proceso de envejecimiento normal. La compresin de los nervios cervicales puede causar dolor o adormecimiento que se extiende desde el cuello hasta el brazo y los dedos de la [place]o. Generalmente, esta afeccin mejora con reposo. Si no mejora, tal vez sea necesario administrar un tratamiento.  CAUSAS Esta afeccin puede ser causada por lo siguiente:  Lesiones.  Desplazamiento de un disco (hernia).  Rigidez en el cuello debido al Aflac Incorporateduso excesivo.  Artritis.  Fractura o degeneracin de los huesos y las articulaciones de la columna (espondiloartrosis) debido al envejecimiento.  Espolones seos que pueden formarse cerca de los nervios cervicales. SNTOMAS Los sntomas de esta afeccin incluyen lo siguiente:  Dolor que se extiende desde el cuello Whole Foodshasta  el brazo y Engineer, site. El dolor puede ser intenso o molesto y empeorar al mover el cuello.  Adormecimiento o debilidad del brazo y Office manager. DIAGNSTICO Esta afeccin se puede diagnosticar en funcin de los sntomas, la historia clnica y un examen fsico. Tambin pueden hacerle exmenes, que incluyen los  siguientes:  Radiografas.  Tomografa computarizada.  Resonancia magntica.  Electromiograma (EMG).  Pruebas de conduccin nerviosa. TRATAMIENTO En muchos casos, no se requiere tratamiento para esta afeccin. Con reposo, esta suele mejorar con [employer]. Si es Publishing rights manager, las opciones pueden incluir lo siguiente:  Usar un collarn blando durante perodos breves.  Fisioterapia para fortalecer los msculos del cuello.  Medicamentos, como los antiinflamatorios no esteroides (AINE), los corticoides por va oral o las inyecciones en la columna vertebral.  Ciruga. Esta puede ser necesaria si otros tratamientos no son eficaces. Se pueden realizar varios tipos de [place] en funcin de la causa de los [place]. INSTRUCCIONES PARA EL CUIDADO EN EL HOGAR Control del [employer] de venta libre y los recetados solamente como se lo haya indicado el mdico.  Si se lo indican, aplique hielo en la zona afectada.  Ponga el hielo en una bolsa plstica.  Coloque una toalla entre la piel y la bolsa de hielo.  Coloque el hielo durante , 2 a 3veces por Futures trader.  Si el hielo no le Research scientist (life sciences), intente Company secretary. Tome una ducha o un bao tibios, o colquese una compresa de calor como se lo haya indicado el mdico.  Intente darse un masaje suave en el cuello y el hombro para ayudar a Paramedic los sntomas. Actividad  Descanse todo lo que sea necesario. Siga las indicaciones del mdico respecto de las restricciones en las actividades.  Realice ejercicios de elongacin y fortalecimiento como se lo hayan indicado el mdico o el fisioterapeuta. Instrucciones generales  Si le indicaron que use un collarn, selo como se lo haya indicado el mdico.  Use una almohada plana para dormir.  Concurra a todas las visitas de control como se lo haya indicado el mdico. Esto es importante. SOLICITE ATENCIN MDICA SI:  La afeccin no mejora con  tratamiento. SOLICITE ATENCIN MDICA DE INMEDIATO SI:  El dolor se intensifica y no se alivia con los [employer] Parcel.  Siente debilidad o adormecimiento en la mano, el brazo, el rostro o la pierna.  Tiene fiebre alta.  Tiene rigidez de cuello.  Pierde el control de la vejiga o los intestinos (tiene incontinencia).  Tiene dificultad para caminar, mantener el equilibrio o hablar.   Esta informacin no tiene Theme park manager el consejo del mdico. Asegrese de hacerle al mdico cualquier pregunta que tenga.   Document Released: 11/11/2004 Document Revised: 10/23/2014 Elsevier Interactive Patient Education 2016 [employer]. Engineer, agricultural  (Sciatica)  La citica es el dolor, debilidad, entumecimiento u hormigueo a lo largo del nervio citico. El nervio comienza en la zona inferior de la espalda y desciende por la parte posterior de cada pierna. El nervio controla los msculos de la parte inferior de la pierna y de la zona posterior de la rodilla, y transmite la sensibilidad a la parte posterior del muslo, la pierna y la planta del pie. La citica es un sntoma de otras afecciones mdicas. Por ejemplo, un dao a los nervios o algunas enfermedades como un disco herniado o un espoln seo en la columna vertebral, podran daarle o presionar en el nervio citico. Esto causa dolor, debilidad y otras sensaciones normalmente asociadas  con la citica. Generalmente la citica afecta slo un lado del cuerpo. CAUSAS   Disco herniado o desplazado.  Enfermedad degenerativa del disco.  Un sndrome doloroso que compromete un msculo angosto de los glteos (sndrome piriforme).  Lesin o fractura plvica.  Embarazo.  Tumor (casos raros). SNTOMAS  Los sntomas pueden variar de leves a muy graves. Por lo general, los sntomas descienden desde la zona lumbar a las nalgas y la parte posterior de la pierna. Ellos son:   Hormigueo leve o dolor sordo en la parte inferior de la espalda, la pierna o la  cadera.  Adormecimiento en la parte posterior de la pantorrilla o la planta del pie.  Sensacin de [employer] zona lumbar, la pierna o la cadera.  Dolor agudo en la zona inferior de la espalda, la pierna o la cadera.  Debilidad en las piernas.  Dolor de espalda intenso que [employer] movimientos. Los sntomas pueden empeorar al toser, Engineering geologist, rer o estar sentado o parado durante [employer]. Adems, el sobrepeso puede empeorar los sntomas.  DIAGNSTICO  Su mdico le har un examen fsico para buscar los sntomas comunes de la citica. Le pedir que haga algunos movimientos o actividades que activaran el dolor del nervio citico. Para encontrar las causas de la citica podr indicarle otros estudios. Estos pueden ser:   Anlisis de [place].  Radiografas.  Pruebas de diagnstico por imgenes, como resonancia magntica o tomografa computada. TRATAMIENTO  El tratamiento se dirige a las causas de la citica. A veces, el tratamiento no es necesario, y Chief Technology Officer y Environmental health practitioner desaparecen por s mismos. Si necesita tratamiento, su mdico puede sugerir:   Medicamentos de venta libre para Engineer, materials.  Medicamentos recetados, como antiinflamatorios, relajantes musculares o narcticos.  Aplicacin de calor o hielo en la zona del dolor.  Inyecciones de corticoides para disminuir el dolor, la irritacin y la inflamacin alrededor del nervio.  Reduccin de la [employer] perodos de [place].  Ejercicios y estiramiento del abdomen para fortalecer y Scientist, clinical (histocompatibility and immunogenetics) la flexibilidad de la columna vertebral. Su mdico puede sugerirle perder peso si el peso extra empeora el dolor de espalda.  Fisioterapia.  La ciruga para eliminar lo que presiona o pincha el nervio, como un espoln seo o parte de una hernia de disco. INSTRUCCIONES PARA EL CUIDADO EN EL HOGAR   Slo tome medicamentos de venta libre o recetados para Primary school teacher o Environmental health practitioner, segn las indicaciones de su  mdico.  Aplique hielo sobre el rea dolorida durante 20 minutos 3-4 veces por da durante los primeras 48-72 horas. Luego intente aplicar calor de la misma manera.  Haga ejercicios, elongue o realice sus actividades habituales, si no le causan ms dolor.  Cumpla con todas las sesiones de fisioterapia, segn le indique su mdico.  Cumpla con todas las visitas de control, segn le indique su mdico.  No use tacones altos o zapatos que no tengan buen apoyo.  Verifique que el colchn no sea muy blando. Un colchn firme Engineer, materials y las [place]. SOLICITE ATENCIN MDICA DE INMEDIATO SI:   Pierde el control de la vejiga o del intestino (incontinencia).  Aumenta la debilidad en la zona inferior de la espalda, la pelvis, las nalgas o las piernas.  Siente irritacin o inflamacin en la espalda.  Tiene sensacin de ardor al ConocoPhillips.  El dolor empeora cuando se acuesta o lo despierta por la noche.  El dolor es peor del que experiment en el pasado.  Dura ms de 4 semanas.  Pierde peso sin motivo de Dendron sbita. ASEGRESE DE QUE:   Comprende estas instrucciones.  Controlar su enfermedad.  Solicitar ayuda de inmediato si no mejora o si empeora.   Esta informacin no tiene Theme park manager el consejo del mdico. Asegrese de hacerle al mdico cualquier pregunta que tenga.   Document Released: 02/01/2005   Allergic Rhinitis Allergic rhinitis is when the mucous membranes in the nose respond to allergens. Allergens are particles in the air that cause your body to have an allergic reaction. This causes you to release allergic antibodies. Through a chain of events, these eventually cause you to release histamine into the blood stream. Although meant to protect the body, it is this release of histamine that causes your discomfort, such as frequent sneezing, congestion, and an itchy, runny nose.  CAUSES Seasonal allergic rhinitis (hay fever) is caused by pollen allergens that  may come from grasses, trees, and weeds. Year-round allergic rhinitis (perennial allergic rhinitis) is caused by allergens such as house dust mites, pet dander, and mold spores. SYMPTOMS  Nasal stuffiness (congestion).  Itchy, runny nose with sneezing and tearing of the eyes. DIAGNOSIS Your health care provider can help you determine the allergen or allergens that trigger your symptoms. If you and your health care provider are unable to determine the allergen, skin or blood testing may be used. Your health care provider will diagnose your condition after taking your health history and performing a physical exam. Your health care provider may assess you for other related conditions, such as asthma, pink eye, or an ear infection. TREATMENT Allergic rhinitis does not have a cure, but it can be controlled by:  Medicines that block allergy symptoms. These may include allergy shots, nasal sprays, and oral antihistamines.  Avoiding the allergen. Hay fever may often be treated with antihistamines in pill or nasal spray forms. Antihistamines block the effects of histamine. There are over-the-counter medicines that may help with nasal congestion and swelling around the eyes. Check with your health care provider before taking or giving this medicine. If avoiding the allergen or the medicine prescribed do not work, there are many new medicines your health care provider can prescribe. Stronger medicine may be used if initial measures are ineffective. Desensitizing injections can be used if medicine and avoidance does not work. Desensitization is when a patient is given ongoing shots until the body becomes less sensitive to the allergen. Make sure you follow up with your health care provider if problems continue. HOME CARE INSTRUCTIONS It is not possible to completely avoid allergens, but you can reduce your symptoms by taking steps to limit your exposure to them. It helps to know exactly what you are allergic to  so that you can avoid your specific triggers. SEEK MEDICAL CARE IF:  You have a fever.  You develop a cough that does not stop easily (persistent).  You have shortness of breath.  You start wheezing.  Symptoms interfere with normal daily activities.   This information is not intended to replace advice given to you by your health care provider. Make sure you discuss any questions you have with your health care provider.   Document Released: 10/27/2000 Document Revised: 02/22/2014 Document Reviewed: 10/09/2012 Elsevier Interactive Patient Education [employer] Inc.

## 2015-05-23 NOTE — Progress Notes (Signed)
Subjective:  By signing my name below, I, Alisha Hall, attest that this documentation has been prepared under the direction and in the presence of Alisha StaggersJeffrey Garnett Rekowski, MD. Electronically Signed: Stann Oresung-Kai Hall, Scribe. 05/23/2015 , 6:48 PM .  Patient was seen in Room 3 .   Patient ID: Alisha Hall, female    DOB: 02/26/1980, 35 y.o.   MRN: [id number] Chief Complaint  Patient presents with  . Shoulder Pain  . Leg Pain  . Vaginal Pain   HPI Alisha Hall is a 35 y.o. female Here for multiple concerns today including shoulder pain, leg pain and vaginal pain.  She denies having a PCP. Her daughter is translating for the patient. Currently not working.   Shoulder Pain Patient states having intermittent right shoulder pain that started about 10 days ago. She's had a similar issue in the past and was seen by another doctor. They informed her that it was due to stress. She's taken ibuprofen BID for the past week with temporary mild relief. She informs that it starts from her right neck, radiates to right shoulder and down her right arm. She denies any known injuries to the area. She denies change in activity. She denies any weakness.   She's right hand dominant.   Leg Pain She's been having right leg pain for about 2 weeks. She denies any change in activity.   Vaginal Pain Patient also noticed a bump in her vagina yesterday. She notes having some pain in the area. She denies any discharge from the area.   Sinus Pressure She also mentions having some nasal congestion and sinus pressure with clear rhinorrhea ongoing for about 3 weeks. She's had some sneezing and eyes itching with this. She's tried using flonase nasal spray BID as she's used it in the past for relief. She denies any side effects with the nasal spray.   Patient Active Problem List   Diagnosis Date Noted  . NVD (normal vaginal delivery) 04/16/2013   Past Medical History  Diagnosis Date  . Medical  history non-contributory    Past Surgical History  Procedure Laterality Date  . No past surgeries     No Known Allergies Prior to Admission medications   Medication Sig Start Date End Date Taking? Authorizing Provider  acetaminophen (TYLENOL) 500 MG tablet Take 500 mg by mouth daily as needed for mild pain.   Yes Historical Provider, MD  ibuprofen (ADVIL,MOTRIN) 600 MG tablet Take 1 tablet (600 mg total) by mouth every 6 (six) hours as needed. 04/24/13  Yes Vale HavenKeli L Beck, MD  ipratropium (ATROVENT) 0.03 % nasal spray Place 2 sprays into both nostrils 2 (two) times daily. 01/30/15  Yes Peyton Najjaravid H Hopper, MD   Social History   Social History  . Marital Status: Single    Spouse Name: N/A  . Number of Children: N/A  . Years of Education: N/A   Occupational History  . Not on file.   Social History Main Topics  . Smoking status: Never Smoker   . Smokeless tobacco: Not on file  . Alcohol Use: No  . Drug Use: No  . Sexual Activity: Yes    Birth Control/ Protection: None   Other Topics Concern  . Not on file   Social History Narrative   Review of Systems  Constitutional: Negative for fever, chills and fatigue.  HENT: Positive for congestion, rhinorrhea, sinus pressure and sneezing.   Eyes: Positive for itching.  Genitourinary: Positive for vaginal pain. Negative for vaginal discharge.  Musculoskeletal: Positive for myalgias, arthralgias and neck pain. Negative for back pain and neck stiffness.       Objective:   Physical Exam  Constitutional: She is oriented to person, place, and time. She appears well-developed and well-nourished. No distress.  HENT:  Head: Normocephalic and atraumatic.  Right Ear: Hearing, tympanic membrane, external ear and ear canal normal.  Left Ear: Hearing, tympanic membrane, external ear and ear canal normal.  Nose: Right sinus exhibits maxillary sinus tenderness and frontal sinus tenderness. Left sinus exhibits frontal sinus tenderness. Left sinus  exhibits no maxillary sinus tenderness.  Mouth/Throat: Oropharynx is clear and moist and mucous membranes are normal. No oropharyngeal exudate.  Eyes: Conjunctivae and EOM are normal. Pupils are equal, round, and reactive to light.  Neck:  Tender along right paraspinals of the neck, no midline bony tenderness of C-spine, decreased right rotation and right extension  Cardiovascular: Normal rate, regular rhythm, normal heart sounds and intact distal pulses.  Exam reveals no gallop and no friction rub.   No murmur heard. Pulmonary/Chest: Effort normal and breath sounds normal. No respiratory distress. She has no wheezes. She has no rhonchi.  Musculoskeletal:  strength equal in upper extremities bilaterally, tender diffusely along right upper arm, no bony tenderness at elbow, no clavicle tenderness, no bony tenderness at shoulder, full rom of right shoulder  Back: L-spine non tender, tender at sciatic notch, equal rom of L-spine, negative seated straight leg raise bilaterally  No tenderness over trochanter bursa, painfree internal and external rotation  Neurological: She is alert and oriented to person, place, and time. She displays no Babinski's sign on the right side. She displays no Babinski's sign on the left side.  Reflex Scores:      Tricep reflexes are 2+ on the right side and 2+ on the left side.      Bicep reflexes are 2+ on the right side and 2+ on the left side.      Brachioradialis reflexes are 2+ on the right side and 2+ on the left side.      Patellar reflexes are 2+ on the right side and 2+ on the left side.      Achilles reflexes are 2+ on the right side and 2+ on the left side. Skin: Skin is warm and dry. No rash noted.  Right upper thigh, lateral to labia majora: 1cm indurated tender area with small central papule, no fluctuance, no surrounding erythema; labia unaffected  Psychiatric: She has a normal mood and affect. Her behavior is normal.  Vitals reviewed.  Filed Vitals:    05/23/15 1752  BP: 128/80  Pulse: 85  Temp: 98 F (36.7 C)  TempSrc: Oral  Resp: 17  Height: 5' 1.5" (1.562 m)  Weight: 147 lb (66.679 kg)  SpO2: 98%   At end of visit, patient asked about breast feeding just once in an afternoon, so patient was brought back to have medications were reevaluated.   Advised to not take mobic, but to take ibuprofen instead. Also advised to take flexeril at night only. Discussed risk of tooth staining with long course of doxycycline but with short course of antibiotic should be of no concern.     Assessment & Plan:   Gissele Narducci is a 35 y.o. female Right cervical radiculopathy - Plan: cyclobenzaprine (FLEXERIL) 5 MG tablet, DISCONTINUED: meloxicam (MOBIC) 7.5 MG tablet  -suspect mm spasm or cervical source. No weakness or reflex difference on exam.   -continue ibuprofen, flexeril at night (advised not  while breastfeeding), heat/rom/stretches and recheck in 1 week if not improved.   Right sided sciatica - Plan: cyclobenzaprine (FLEXERIL) 5 MG tablet, DISCONTINUED: meloxicam (MOBIC) 7.5 MG tablet Right leg pain - Plan: cyclobenzaprine (FLEXERIL) 5 MG tablet, DISCONTINUED: meloxicam (MOBIC) 7.5 MG tablet  -no weakness. Flexeril and ibuprofen as above, handout given. recheck in 1 week if not improved, sooner if worse.   Sinus congestion Allergic rhinitis, unspecified allergic rhinitis type  -likely allergies at this point. Continue atrovent nasal spray antihistamine as needed and RTC precautions. Doubt sinusitis, but will be on doxycycline for cellulitis.   Acute folliculitis - Plan: doxycycline (VIBRA-TABS) 100 MG tablet Cellulitis of leg, right  -small area of early folliculitis/cellulitis. No drainable abscess at present. Warm compresses, start doxycycline and recheck in next few days if enlarging.   Language barrier  -spanish spoken, understanding expressed.   Meds ordered this encounter  Medications  . DISCONTD: meloxicam (MOBIC)  7.5 MG tablet    Sig: Take 1 tablet (7.5 mg total) by mouth daily as needed for pain. Label in spanish    Dispense:  30 tablet    Refill:  0  . cyclobenzaprine (FLEXERIL) 5 MG tablet    Sig: 1 pill by mouth up to every 8 hours as needed. Start with one pill by mouth each bedtime as needed due to sedation    Dispense:  15 tablet    Refill:  0    Label in spanish  . doxycycline (VIBRA-TABS) 100 MG tablet    Sig: Take 1 tablet (100 mg total) by mouth 2 (two) times daily. label in spanish    Dispense:  20 tablet    Refill:  0   Patient Instructions       IF you received an x-ray today, you will receive an invoice from [facility]. Please contact [facility] at [phone] with questions or concerns regarding your invoice.   IF you received labwork today, you will receive an invoice from [employer]. Please contact [facility] at [phone] with questions or concerns regarding your invoice.   Our billing staff will not be able to assist you with questions regarding bills from these companies.  You will be contacted with the lab results as soon as they are available. The fastest way to get your results is to activate your My Chart account. Instructions are located on the last page of this paperwork. If you have not heard from [place] regarding the results in 2 weeks, please contact this office.    toalla calor o bano calor de area enchado cerca de vagina 4-5 veces cada dia, y antibiotico. regrese si mas grande.   continuarse medicina por alergias.   Ibuprofen durnate dia y cyclobenzaprine si necesario en la noche por ombro y pierna, pero regrese en proximo semana si no esta mejor.  Mas temorano si empeorse.   Celulitis (Cellulitis)  La celulitis es una infeccin de la piel y del tejido que se encuentra debajo de la piel. El rea infectada generalmente est de color rojo y duele. Ocurre con ms frecuencia en los brazos y en las piernas. CUIDADOS  EN EL HOGAR   Tome los antibiticos tal como se le indic. Finalice el [place], aunque comience a Actor.  Mantenga el brazo o la pierna infectada elevada.  Aplique paos calientes en la zona hasta 4 veces al da.  Tome slo los medicamentos que le haya indicado el mdico.  Cumpla con los controles mdicos segn las indicaciones. SOLICITE  AYUDA SI:  Observa una lnea roja en la piel que sale desde la herida.  El rea roja se extiende o se vuelve de color oscuro.  El hueso o la articulacin que se encuentran por debajo de la zona infectada le duelen despus de que la piel se [place].  La infeccin se repite en la misma zona o en una zona diferente.  Tiene un bulto inflamado (hinchado) en la zona infectada.  Aparecen nuevos sntomas.  Tiene fiebre. SOLICITE AYUDA DE INMEDIATO SI:   Se siente muy somnoliento.  Vomita o tiene diarrea.  Se siente enfermo y tiene [employer].   Esta informacin no tiene Theme park manager el consejo del mdico. Asegrese de hacerle al mdico cualquier pregunta que tenga.   Document Released: 07/22/2009 Document Revised: 10/23/2014 Elsevier Interactive Patient Education 2016 Elsevier Inc.  Radiculopata cervical (Cervical Radiculopathy) La radiculopata cervical se presenta cuando un nervio del cuello (nervio cervical) est comprimido o daado. Esta afeccin puede producirse debido a una lesin o como parte del proceso de envejecimiento normal. La compresin de los nervios cervicales puede causar dolor o adormecimiento que se extiende desde el cuello hasta el brazo y los dedos de la [place]. Generalmente, esta afeccin mejora con reposo. Si no mejora, tal vez sea necesario administrar un tratamiento.  CAUSAS Esta afeccin puede ser causada por lo siguiente:  Lesiones.  Desplazamiento de un disco (hernia).  Rigidez en el cuello debido al [employer].  Artritis.  Fractura o degeneracin de los huesos y las articulaciones de  la columna (espondiloartrosis) debido al envejecimiento.  Espolones seos que pueden formarse cerca de los nervios cervicales. SNTOMAS Los sntomas de esta afeccin incluyen lo siguiente:  Dolor que se extiende desde el cuello hasta el brazo y [place]. El dolor puede ser intenso o molesto y empeorar al mover el cuello.  Adormecimiento o debilidad del brazo y Office manager. DIAGNSTICO Esta afeccin se puede diagnosticar en funcin de los sntomas, la historia clnica y un examen fsico. Tambin pueden hacerle exmenes, que incluyen los siguientes:  Radiografas.  Tomografa computarizada.  Resonancia magntica.  Electromiograma (EMG).  Pruebas de conduccin nerviosa. TRATAMIENTO En muchos casos, no se requiere tratamiento para esta afeccin. Con reposo, esta suele mejorar con [employer]. Si es Publishing rights manager, las opciones pueden incluir lo siguiente:  Usar un collarn blando durante perodos breves.  Fisioterapia para fortalecer los msculos del cuello.  Medicamentos, como los antiinflamatorios no esteroides (AINE), los corticoides por va oral o las inyecciones en la columna vertebral.  Ciruga. Esta puede ser necesaria si otros tratamientos no son eficaces. Se pueden realizar varios tipos de [place] en funcin de la causa de los [place]. INSTRUCCIONES PARA EL CUIDADO EN EL HOGAR Control del [employer] de venta libre y los recetados solamente como se lo haya indicado el mdico.  Si se lo indican, aplique hielo en la zona afectada.  Ponga el hielo en una bolsa plstica.  Coloque una toalla entre la piel y la bolsa de hielo.  Coloque el hielo durante , 2 a 3veces por Futures trader.  Si el hielo no le Research scientist (life sciences), intente Company secretary. Tome una ducha o un bao tibios, o colquese una compresa de calor como se lo haya indicado el mdico.  Intente darse un masaje suave en el cuello y el hombro para ayudar a Paramedic los  sntomas. Actividad  Descanse todo lo que sea necesario. Siga las indicaciones del mdico respecto de las restricciones en  las [place].  Realice ejercicios de elongacin y fortalecimiento como se lo hayan indicado el mdico o el fisioterapeuta. Instrucciones generales  Si le indicaron que use un collarn, selo como se lo haya indicado el mdico.  Use una almohada plana para dormir.  Concurra a todas las visitas de control como se lo haya indicado el mdico. Esto es importante. SOLICITE ATENCIN MDICA SI:  La afeccin no mejora con tratamiento. SOLICITE ATENCIN MDICA DE INMEDIATO SI:  El dolor se intensifica y no se alivia con los [employer].  Siente debilidad o adormecimiento en la mano, el brazo, el rostro o la pierna.  Tiene fiebre alta.  Tiene rigidez de cuello.  Pierde el control de la vejiga o los intestinos (tiene incontinencia).  Tiene dificultad para caminar, mantener el equilibrio o hablar.   Esta informacin no tiene Theme park manager el consejo del mdico. Asegrese de hacerle al mdico cualquier pregunta que tenga.   Document Released: 11/11/2004 Document Revised: 10/23/2014 Elsevier Interactive Patient Education 2016 [employer]. Engineer, agricultural  (Sciatica)  La citica es el dolor, debilidad, entumecimiento u hormigueo a lo largo del nervio citico. El nervio comienza en la zona inferior de la espalda y desciende por la parte posterior de cada pierna. El nervio controla los msculos de la parte inferior de la pierna y de la zona posterior de la rodilla, y transmite la sensibilidad a la parte posterior del muslo, la pierna y la planta del pie. La citica es un sntoma de otras afecciones mdicas. Por ejemplo, un dao a los nervios o algunas enfermedades como un disco herniado o un espoln seo en la columna vertebral, podran daarle o presionar en el nervio citico. Esto causa dolor, debilidad y otras sensaciones normalmente asociadas con la citica.  Generalmente la citica afecta slo un lado del cuerpo. CAUSAS   Disco herniado o desplazado.  Enfermedad degenerativa del disco.  Un sndrome doloroso que compromete un msculo angosto de los glteos (sndrome piriforme).  Lesin o fractura plvica.  Embarazo.  Tumor (casos raros). SNTOMAS  Los sntomas pueden variar de leves a muy graves. Por lo general, los sntomas descienden desde la zona lumbar a las nalgas y la parte posterior de la pierna. Ellos son:   Hormigueo leve o dolor sordo en la parte inferior de la espalda, la pierna o la cadera.  Adormecimiento en la parte posterior de la pantorrilla o la planta del pie.  Sensacin de [employer] zona lumbar, la pierna o la cadera.  Dolor agudo en la zona inferior de la espalda, la pierna o la cadera.  Debilidad en las piernas.  Dolor de espalda intenso que [employer] movimientos. Los sntomas pueden empeorar al toser, Engineering geologist, rer o estar sentado o parado durante [employer]. Adems, el sobrepeso puede empeorar los sntomas.  DIAGNSTICO  Su mdico le har un examen fsico para buscar los sntomas comunes de la citica. Le pedir que haga algunos movimientos o actividades que activaran el dolor del nervio citico. Para encontrar las causas de la citica podr indicarle otros estudios. Estos pueden ser:   Anlisis de [place].  Radiografas.  Pruebas de diagnstico por imgenes, como resonancia magntica o tomografa computada. TRATAMIENTO  El tratamiento se dirige a las causas de la citica. A veces, el tratamiento no es necesario, y Chief Technology Officer y Environmental health practitioner desaparecen por s mismos. Si necesita tratamiento, su mdico puede sugerir:   Medicamentos de venta libre para Engineer, materials.  Medicamentos recetados, como antiinflamatorios, relajantes musculares o narcticos.  Aplicacin de calor o hielo en la zona del dolor.  Inyecciones de corticoides para disminuir el dolor, la irritacin y la inflamacin alrededor  del nervio.  Reduccin de la [employer] perodos de Reader.  Ejercicios y estiramiento del abdomen para fortalecer y Scientist, clinical (histocompatibility and immunogenetics) la flexibilidad de la columna vertebral. Su mdico puede sugerirle perder peso si el peso extra empeora el dolor de espalda.  Fisioterapia.  La ciruga para eliminar lo que presiona o pincha el nervio, como un espoln seo o parte de una hernia de disco. INSTRUCCIONES PARA EL CUIDADO EN EL HOGAR   Slo tome medicamentos de venta libre o recetados para Primary school teacher o Environmental health practitioner, segn las indicaciones de su mdico.  Aplique hielo sobre el rea dolorida durante 20 minutos 3-4 veces por da durante los primeras 48-72 horas. Luego intente aplicar calor de la misma manera.  Haga ejercicios, elongue o realice sus actividades habituales, si no le causan ms dolor.  Cumpla con todas las sesiones de fisioterapia, segn le indique su mdico.  Cumpla con todas las visitas de control, segn le indique su mdico.  No use tacones altos o zapatos que no tengan buen apoyo.  Verifique que el colchn no sea muy blando. Un colchn firme Engineer, materials y las [place]. SOLICITE ATENCIN MDICA DE INMEDIATO SI:   Pierde el control de la vejiga o del intestino (incontinencia).  Aumenta la debilidad en la zona inferior de la espalda, la pelvis, las nalgas o las piernas.  Siente irritacin o inflamacin en la espalda.  Tiene sensacin de ardor al [employer].  El dolor empeora cuando se acuesta o lo despierta por la noche.  El dolor es peor del que experiment en el pasado.  Dura ms de 4 semanas.  Pierde peso sin motivo de [place] sbita. ASEGRESE DE QUE:   Comprende estas instrucciones.  Controlar su enfermedad.  Solicitar ayuda de inmediato si no mejora o si empeora.   Esta informacin no tiene Theme park manager el consejo del mdico. Asegrese de hacerle al mdico cualquier pregunta que tenga.   Document Released: 02/01/2005   Allergic  Rhinitis Allergic rhinitis is when the mucous membranes in the nose respond to allergens. Allergens are particles in the air that cause your body to have an allergic reaction. This causes you to release allergic antibodies. Through a chain of events, these eventually cause you to release histamine into the blood stream. Although meant to protect the body, it is this release of histamine that causes your discomfort, such as frequent sneezing, congestion, and an itchy, runny nose.  CAUSES Seasonal allergic rhinitis (hay fever) is caused by pollen allergens that may come from grasses, trees, and weeds. Year-round allergic rhinitis (perennial allergic rhinitis) is caused by allergens such as house dust mites, pet dander, and mold spores. SYMPTOMS  Nasal stuffiness (congestion).  Itchy, runny nose with sneezing and tearing of the eyes. DIAGNOSIS Your health care provider can help you determine the allergen or allergens that trigger your symptoms. If you and your health care provider are unable to determine the allergen, skin or blood testing may be used. Your health care provider will diagnose your condition after taking your health history and performing a physical exam. Your health care provider may assess you for other related conditions, such as asthma, pink eye, or an ear infection. TREATMENT Allergic rhinitis does not have a cure, but it can be controlled by:  Medicines that block allergy symptoms. These may include allergy shots, nasal sprays,  and oral antihistamines.  Avoiding the allergen. Hay fever may often be treated with antihistamines in pill or nasal spray forms. Antihistamines block the effects of histamine. There are over-the-counter medicines that may help with nasal congestion and swelling around the eyes. Check with your health care provider before taking or giving this medicine. If avoiding the allergen or the medicine prescribed do not work, there are many new medicines your health  care provider can prescribe. Stronger medicine may be used if initial measures are ineffective. Desensitizing injections can be used if medicine and avoidance does not work. Desensitization is when a patient is given ongoing shots until the body becomes less sensitive to the allergen. Make sure you follow up with your health care provider if problems continue. HOME CARE INSTRUCTIONS It is not possible to completely avoid allergens, but you can reduce your symptoms by taking steps to limit your exposure to them. It helps to know exactly what you are allergic to so that you can avoid your specific triggers. SEEK MEDICAL CARE IF:  You have a fever.  You develop a cough that does not stop easily (persistent).  You have shortness of breath.  You start wheezing.  Symptoms interfere with normal daily activities.   This information is not intended to replace advice given to you by your health care provider. Make sure you discuss any questions you have with your health care provider.   Document Released: 10/27/2000 Document Revised: 02/22/2014 Document Reviewed: 10/09/2012 Elsevier Interactive Patient Education [employer]     I personally performed the services described in this documentation, which was scribed in my presence. The recorded information has been reviewed and considered, and addended by me as needed.

## 2015-05-24 ENCOUNTER — Encounter: Payer: Self-pay | Admitting: Family Medicine

## 2015-05-24 ENCOUNTER — Telehealth: Payer: Self-pay

## 2015-05-24 NOTE — Telephone Encounter (Signed)
Pt is needing to talk with someone about her medication   Best number [phone]

## 2015-05-26 NOTE — Telephone Encounter (Signed)
Left message for pt to call back  °

## 2016-01-30 ENCOUNTER — Ambulatory Visit (INDEPENDENT_AMBULATORY_CARE_PROVIDER_SITE_OTHER): Payer: Self-pay | Admitting: Physician Assistant

## 2016-01-30 ENCOUNTER — Telehealth: Payer: Self-pay | Admitting: Emergency Medicine

## 2016-01-30 VITALS — BP 120/76 | HR 66 | Temp 98.0°F | Resp 16 | Ht 60.5 in | Wt 145.8 lb

## 2016-01-30 DIAGNOSIS — M549 Dorsalgia, unspecified: Secondary | ICD-10-CM

## 2016-01-30 LAB — POCT URINALYSIS DIP (MANUAL ENTRY)
BILIRUBIN UA: NEGATIVE
Glucose, UA: NEGATIVE
Ketones, POC UA: NEGATIVE
NITRITE UA: NEGATIVE
Protein Ur, POC: NEGATIVE
Spec Grav, UA: 1.025
UROBILINOGEN UA: 0.2
pH, UA: 7

## 2016-01-30 LAB — POC MICROSCOPIC URINALYSIS (UMFC): Mucus: ABSENT

## 2016-01-30 MED ORDER — NAPROXEN 500 MG PO TABS: 500.0000 mg | ORAL_TABLET | Freq: Two times a day (BID) | ORAL | 0 refills | Status: DC

## 2016-01-30 NOTE — Progress Notes (Signed)
[facility] [address], [place] [id number] [phone]  Date:  01/30/2016   Name:  Alisha Hall   DOB:  12/24/1980   MRN:  [id number]  PCP:  No PCP Per Patient    History of Present Illness:  Alisha Hall is a 35 y.o. female patient who [facility].   Daughter is Nurse, learning disabilitytranslator for today.  Low back pain that radiates up to her neck and it has a singing sensation.  This has been happening for 2 weeks.  No trauma.  She can not recall any thing occurring when her symptoms started.   She has never had this pain before.  The pain is worsening.  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  No numbness or tingling.  No sob or dyspnea.  No hematuria, dysuria, or frequency.  She take ibuprofen which helps a little   Patient Active Problem List   Diagnosis Date Noted  . NVD (normal vaginal delivery) 04/16/2013    Past Medical History:  Diagnosis Date  . Medical history non-contributory     Past Surgical History:  Procedure Laterality Date  . NO PAST SURGERIES      Social History  Substance Use Topics  . Smoking status: Never Smoker  . Smokeless tobacco: Not on file  . Alcohol use No    Family History  Problem Relation Age of Onset  . Cancer Maternal Grandfather   . Diabetes Mother     No Known Allergies  Medication list has been reviewed and updated.  Current Outpatient Prescriptions on File Prior to Visit  Medication Sig Dispense Refill  . cyclobenzaprine (FLEXERIL) 5 MG tablet 1 pill by mouth up to every 8 hours as needed. Start with one pill by mouth each bedtime as needed due to sedation (Patient not taking: Reported on 01/30/2016) 15 tablet 0   No current facility-administered medications on file prior to visit.     ROS ROS otherwise unremarkable unless listed above.   Physical Examination: BP 120/76 (BP Location: Right Arm, Patient Position: Sitting,  Cuff Size: Normal)   Pulse 66   Temp 98 F (36.7 C) (Oral)   Resp 16   Ht 5' 0.5" (1.537 m)   Wt 145 lb 12.8 oz (66.1 kg)   SpO2 100%   BMI 28.01 kg/m  Ideal Body Weight: Weight in (lb) to have BMI = 25: 129.9  Physical Exam  Constitutional: She is oriented to person, place, and time. She appears well-developed and well-nourished. No distress.  HENT:  Head: Normocephalic and atraumatic.  Right Ear: External ear normal.  Left Ear: External ear normal.  Eyes: Conjunctivae and EOM are normal. Pupils are equal, round, and reactive to light.  Cardiovascular: Normal rate.   Pulmonary/Chest: Effort normal. No respiratory distress.  Musculoskeletal:  Mid back tenderness upon palpation.    Neurological: She is alert and oriented to person, place, and time.  Skin: She is not diaphoretic.  Psychiatric: She has a normal mood and affect. Her behavior is normal.    Results for orders placed or performed in visit on 01/30/16  POCT urinalysis dipstick  Result Value Ref Range   Color, UA yellow yellow   Clarity, UA clear clear   Glucose, UA negative negative   Bilirubin, UA negative negative   Ketones, POC UA negative negative   Spec Grav, UA 1.025    Blood, UA large (A) negative  pH, UA 7.0    Protein Ur, POC negative negative   Urobilinogen, UA 0.2    Nitrite, UA Negative Negative   Leukocytes, UA small (1+) (A) Negative  POCT Microscopic Urinalysis ([facility])  Result Value Ref Range   WBC,UR,HPF,POC Few (A) None WBC/hpf   RBC,UR,HPF,POC Few (A) None RBC/hpf   Bacteria Moderate (A) None, Too numerous to count   Mucus Absent Absent   Epithelial Cells, UR Per Microscopy Few (A) None, Too numerous to count cells/hpf    Assessment and Plan: Alisha Hall is a 35 y.o. female who is here today for back pain. Advised nsaid use.  Advised icing three times for 15 minutes. Advised will start possibly an abx, if ua is positive.  ua obtained.    Acute left-sided back pain,  unspecified back location - Plan: naproxen (NAPROSYN) 500 MG tablet, POCT urinalysis dipstick, POCT Microscopic Urinalysis ([facility])  Trena PlattStephanie Miki Labuda, PA-C [facility] 01/30/2016 5:39 PM

## 2016-01-30 NOTE — Telephone Encounter (Signed)
Left message to call back  If patient calls back let her know [facility].

## 2016-01-30 NOTE — Patient Instructions (Addendum)
Please ice the back three times per day for 15 minutes.   Please take the naprosyn with food.  Ejercicios para la espalda (Back Exercises) Si tiene dolor de espalda, haga estos ejercicios 2 o 3veces por da, o como se lo haya indicado el mdico. Cuando el dolor desaparezca, hgalos una vez por da, pero haga ms repeticiones de cada ejercicio. Si no le duele la espalda, haga estos ejercicios una vez por da o como se lo haya indicado el mdico. EJERCICIOS  Rodilla al pecho  Repita estos pasos 3 o 5veces seguidas con cada pierna: 1. Acustese boca arriba sobre una cama dura o sobre el suelo con las piernas extendidas. 2. Lleve una rodilla al pecho. 3. Mantenga la rodilla contra el pecho. Para lograrlo tmese la rodilla o el muslo. 4. Tire de la rodilla hasta sentir una elongacin suave en la parte baja de la espalda. 5. Mantenga la elongacin durante 10 a 30segundos. 6. Suelte y extienda la pierna lentamente. Inclinacin de la pelvis  Repita estos pasos 5 o 10veces seguidas: 1. Acustese boca arriba sobre una cama dura o sobre el suelo con las piernas extendidas. 2. Flexione las rodillas de manera que apunten al techo. Los pies deben estar apoyados en el suelo. 3. Contraiga los msculos de la parte baja del vientre (abdomen) para empujar la zona lumbar contra el suelo. Este movimiento har que el cccix apunte hacia el techo, en lugar de apuntar hacia abajo en direccin a los pies o al suelo. 4. Mantenga esta posicin durante 5 a 10segundos mientras contrae suavemente los msculos y respira con normalidad. El perro y el gato  Repita estos pasos hasta que la zona lumbar se curve con ms facilidad: 1. Apoye las palmas de las manos y las rodillas sobre una superficie firme. Las manos deben estar alineadas con los hombros y las rodillas con las caderas. Puede colocarse almohadillas debajo de las rodillas. 2. Deje caer la cabeza y lleve el cccix hacia abajo de modo que apunte en direccin al  suelo para que la zona lumbar se arquee como el lomo de un gato [place]ado. 3. Mantenga esta posicin durante 5segundos. 4. Lentamente, levante la cabeza y lleve el cccix hacia arriba de modo que apunte en direccin al techo para que la espalda se arquee (hunda) como el lomo de un perro contento. 5. Mantenga esta posicin durante 5segundos. Flexiones de brazos  Repita estos pasos 5 o 10veces seguidas: 1. Acustese boca abajo en el suelo. 2. Ponga las manos cerca de la cabeza, separadas aproximadamente al ancho de los hombros. 3. Con la espalda relajada y las caderas apoyadas en el suelo, extienda lentamente los brazos para levantar la mitad superior del cuerpo y Optometristelevar los hombros. No use los msculos de la espalda. Para estar ms cmodo, puede cambiar la International Paperubicacin de las manos. 4. Mantenga esta posicin durante 5segundos. 5. Lentamente vuelva a la posicin horizontal. Puentes  Repita estos pasos 10veces seguidas: 1. Acustese boca arriba sobre una superficie firme. 2. Flexione las rodillas de manera que apunten al techo. Los pies deben estar apoyados en el suelo. 3. Contraiga los glteos y despegue las nalgas del suelo hasta que la cintura est casi a la altura de las rodillas. Si no siente el trabajo muscular en las nalgas y la parte posterior de los muslos, aleje los pies 1 o 2pulgadas (2,5 o 5centmetros) de las nalgas. 4. Mantenga esta posicin durante 3 a 5segundos. 5. Lentamente, vuelva a apoyar las nalgas en el suelo  y relaje los glteos. Si este ejercicio le resulta muy fcil, intente realizarlo con los brazos cruzados [place] el pecho.  Abdominales  Repita estos pasos 5 o 10veces seguidas: 1. Acustese boca arriba sobre una cama dura o sobre el suelo con las piernas extendidas. 2. Flexione las rodillas de manera que apunten al techo. Los pies deben estar apoyados en el suelo. 3. Cruce los World Fuel Services Corporationbrazos sobre el pecho. 4. Baje levemente el mentn en direccin al pecho, pero no doble  el cuello. 5. Contraiga los msculos del abdomen y con lentitud eleve el pecho lo suficiente como para despegar levemente los omplatos del suelo. 6. Lentamente baje el pecho y la cabeza hasta el suelo. Elevaciones de espalda  Repita estos pasos 5 o 10veces seguidas: 1. Acustese boca abajo con los brazos a los costados y apoye la frente en el suelo. 2. Contraiga los msculos de las piernas y los glteos. 3. Lentamente despegue el pecho del suelo mientras mantiene las caderas apoyadas en el suelo. Mantenga la nuca alineada con la curvatura de la espalda. Mire hacia el suelo mientras hace este ejercicio. 4. Mantenga esta posicin durante 3 a 5segundos. 5. Lentamente baje el pecho y el rostro hasta el suelo. SOLICITE AYUDA SI:  El dolor de espalda se vuelve mucho ms intenso cuando hace un ejercicio.  El dolor de espalda no se [place] 2horas despus de ARAMARK Corporationhacer los ejercicios. Si tiene alguno de Limited Brandsestos problemas, deje de ARAMARK Corporationhacer los ejercicios. No vuelva a hacer los ejercicios a menos que el mdico lo autorice. SOLICITE AYUDA DE INMEDIATO SI:  Siente un dolor sbito y muy intenso en la espalda. Si esto ocurre, deje de Toys 'R' Ushacer los ejercicios. No vuelva a hacer los ejercicios a menos que el mdico lo autorice. Esta informacin no tiene Theme park managercomo fin reemplazar el consejo del mdico. Asegrese de hacerle al mdico cualquier pregunta que tenga. Document Released: 05/19/2010 Document Revised: 05/26/2015 Document Reviewed: 03/28/2014 Elsevier Interactive Patient Education  2017 [employer]     IF you received an x-ray today, you will receive an invoice from [facility]. Please contact [facility] at [phone] with questions or concerns regarding your invoice.   IF you received labwork today, you will receive an invoice from [facility]. Please contact [facility] at [phone] with questions or concerns regarding your invoice.   Our billing staff will not be able to assist you with  questions regarding bills from these companies.  You will be contacted with the lab results as soon as they are available. The fastest way to get your results is to activate your My Chart account. Instructions are located on the last page of this paperwork. If you have not heard from us regarding the results in 2 weeks, please contact this office.

## 2016-02-02 LAB — URINE CULTURE

## 2016-02-03 MED ORDER — AMOXICILLIN 500 MG PO CAPS: 500.0000 mg | ORAL_CAPSULE | Freq: Three times a day (TID) | ORAL | 0 refills | Status: AC

## 2016-02-03 NOTE — Telephone Encounter (Signed)
[facility] Spanish interpreter # [phone]. Gave pt Stephanie's message. Answered all of her ?s. Advised her to take amox w/food and to RTC if Sxs don't resolve after whole round of amox if finished. Pt verbalized understanding and agreement.

## 2016-02-03 NOTE — Telephone Encounter (Signed)
Spanish speaking.  Please advise that if patient is still having the back pain, there was group b beta hemolytic streptococcus found on her urine culture.  We will treat this with amoxicillin 500mg  every 8 hours.  Please advise that I sent this to her pharmacy.

## 2017-04-03 IMAGING — CR DG ABDOMEN 1V
2 series · 2 of 2 positions shown · non-contrast
Comparison: None.

CLINICAL DATA: Chronic right lower quadrant abdominal pain

EXAM:
ABDOMEN - 1 VIEW

[AP (1 of 2)]
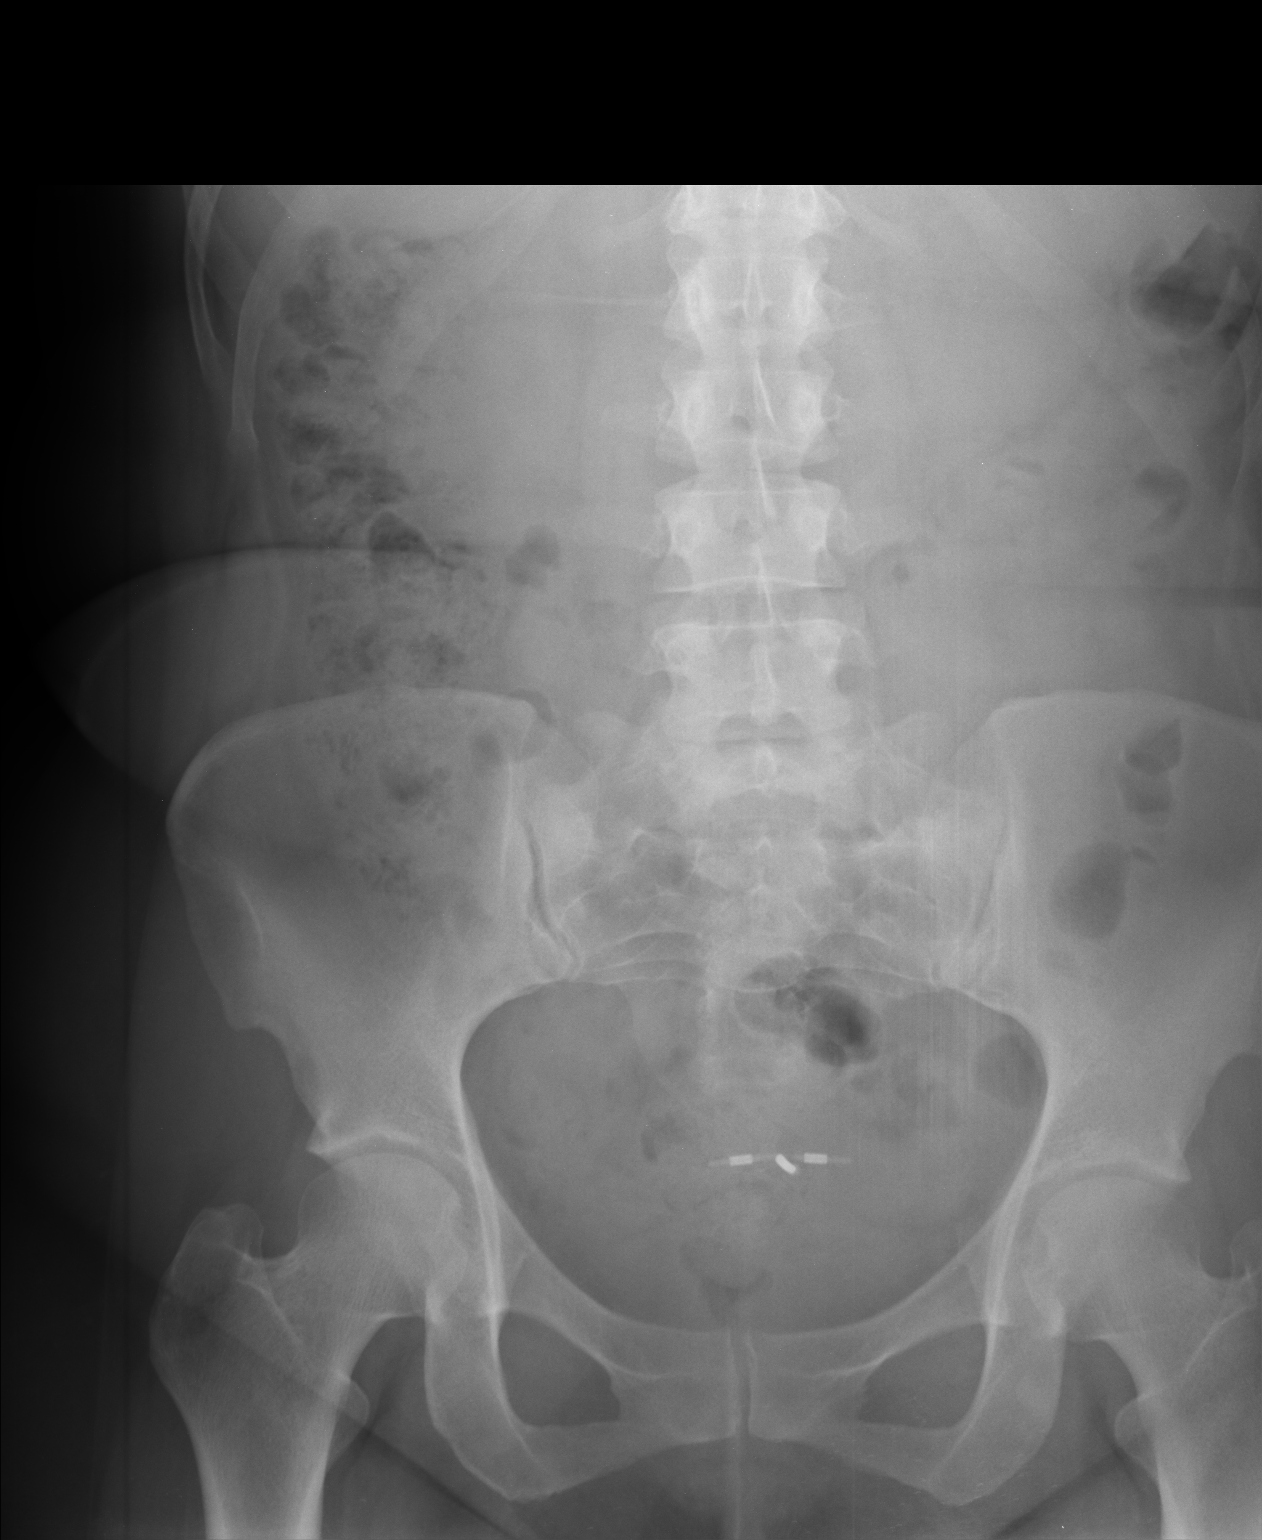

[AP (2 of 2)]
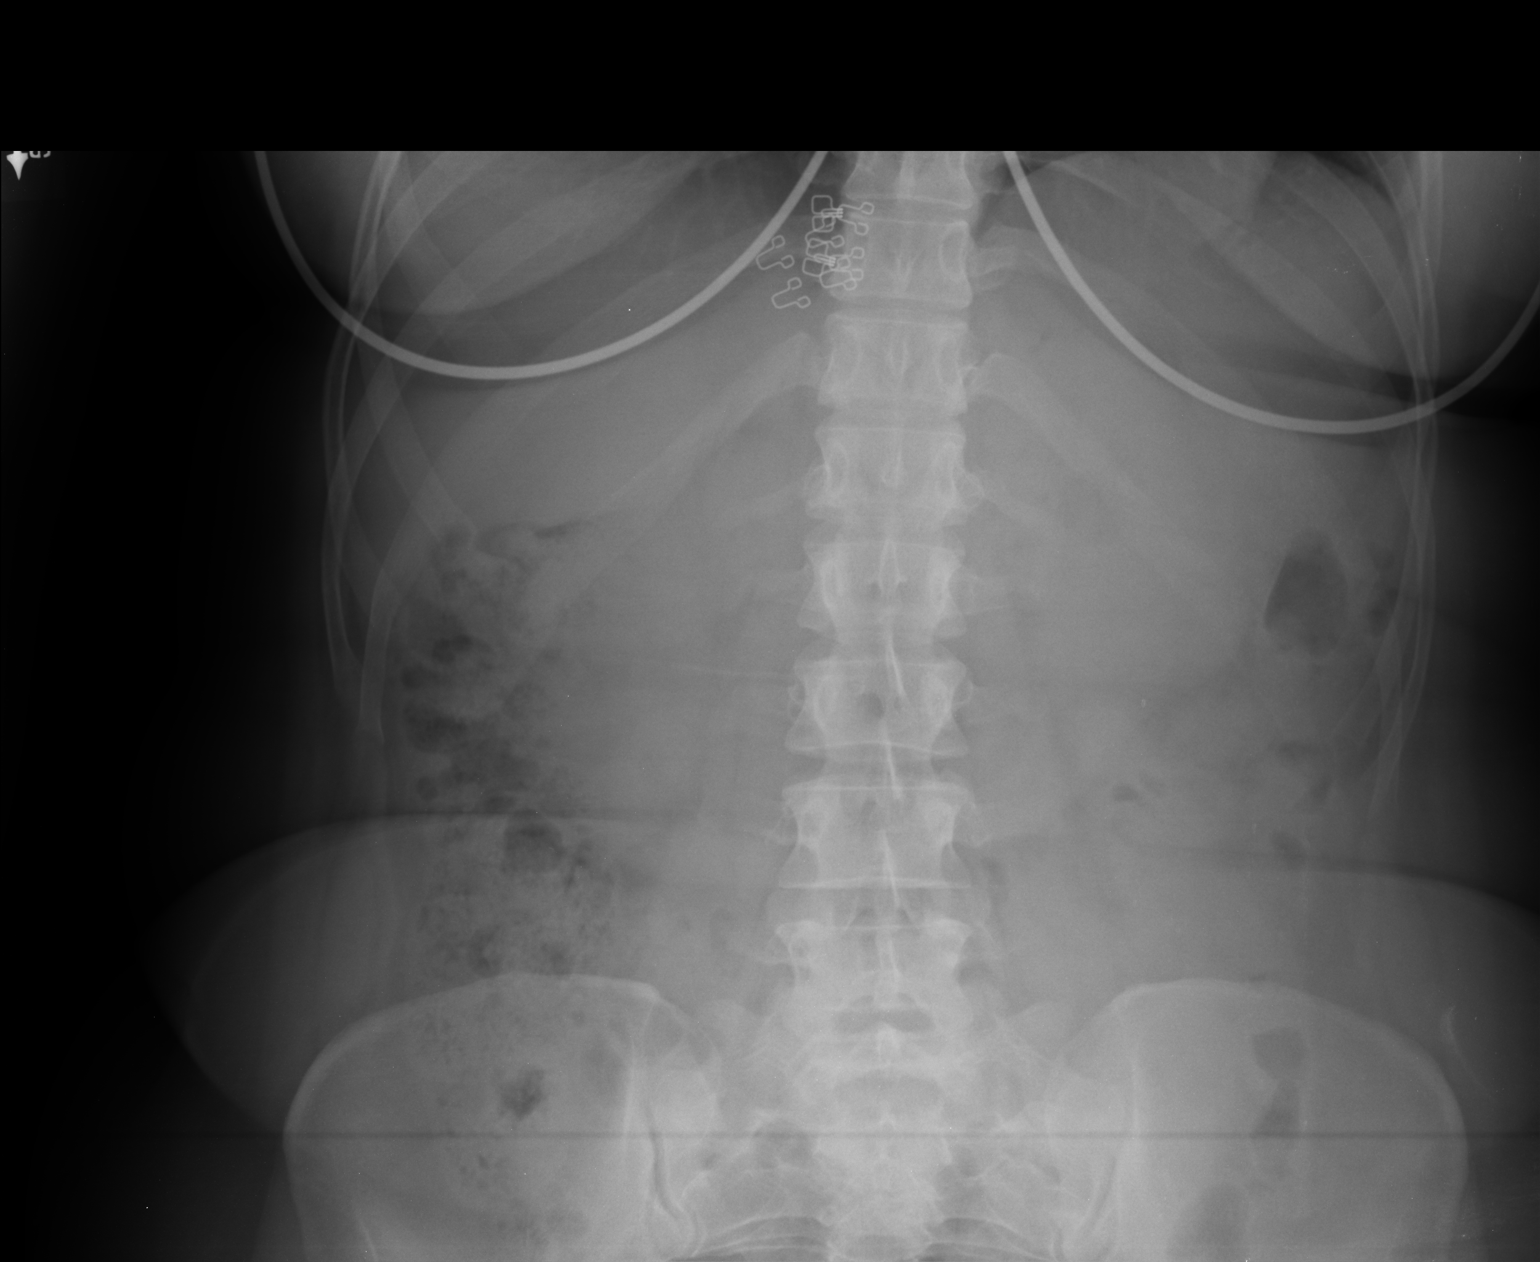

[2 of 2 positions shown; findings below may reference images not displayed]

FINDINGS: Mild fecal retention in the ascending colon and rectum. Intrauterine
device noted. No abnormal opacities. Nonobstructive bowel gas
pattern.
IMPRESSION: No acute findings

## 2020-10-13 ENCOUNTER — Other Ambulatory Visit: Payer: Self-pay

## 2020-10-13 ENCOUNTER — Encounter (HOSPITAL_COMMUNITY): Payer: Self-pay

## 2020-10-13 ENCOUNTER — Ambulatory Visit (HOSPITAL_COMMUNITY)
Admission: EM | Admit: 2020-10-13 | Discharge: 2020-10-13 | Disposition: A | Discharge status: Home / Self Care | Payer: Self-pay | Attending: Internal Medicine | Admitting: Internal Medicine

## 2020-10-13 DIAGNOSIS — M5431 Sciatica, right side: Secondary | ICD-10-CM

## 2020-10-13 MED ORDER — METHOCARBAMOL 500 MG PO TABS: 500.0000 mg | ORAL_TABLET | Freq: Every evening | ORAL | 0 refills | Status: DC | PRN

## 2020-10-13 MED ORDER — KETOROLAC TROMETHAMINE 30 MG/ML IJ SOLN
INTRAMUSCULAR | Status: AC
  Filled 2020-10-13: qty 1

## 2020-10-13 MED ORDER — KETOROLAC TROMETHAMINE 30 MG/ML IJ SOLN
30.0000 mg | Freq: Once | INTRAMUSCULAR | Status: AC
  Administered 2020-10-13: 30 mg via INTRAVENOUS

## 2020-10-13 MED ORDER — PREDNISONE 10 MG PO TABS
30.0000 mg | ORAL_TABLET | Freq: Every day | ORAL | 0 refills | Status: AC
Start: 2020-10-13 — End: 2020-10-18

## 2020-10-13 NOTE — ED Triage Notes (Signed)
Pt presents with RLQ abdominal pain that radiates around to back and right leg X 5 days.

## 2020-10-13 NOTE — Discharge Instructions (Addendum)
Please take medications as prescribed Back strengthening exercises Heating pad on a 15 minutes on-15 minutes off cycle Return to urgent care if symptoms worsen.

## 2020-10-13 NOTE — ED Provider Notes (Addendum)
[facility]    CSN: [id number] Arrival date & time: 10/13/20  1531      History   Chief Complaint Chief Complaint  Patient presents with   Abdominal Pain    HPI Alisha Hall is a 40 y.o. female comes to the urgent care with a 4-day history of low back pain.  Back pain is sharp in nature, severe, aggravated by movement and has not been relieved with over-the-counter pain medication use.  Patient recalls onset of the pain when she was climbing a flight of stairs.  She has been diagnosed with sciatica in the past.  Past treatments with NSAID injection has resulted in significant relief.  Patient admits pain radiates into the right leg and is associated with some numbness in the thigh.  No weakness in the lower extremities.  Patient is able to bear weight.  She denies any falls or trauma to the back.  The pain also radiates into the right groin area.Marland Kitchen   HPI  Past Medical History:  Diagnosis Date   Medical history non-contributory     Patient Active Problem List   Diagnosis Date Noted   NVD (normal vaginal delivery) 04/16/2013    Past Surgical History:  Procedure Laterality Date   NO PAST SURGERIES      OB History     Gravida  4   Para  4   Term  3   Preterm  1   AB      Living  4      SAB      IAB      Ectopic      Multiple      Live Births  4            Home Medications    Prior to Admission medications   Medication Sig Start Date End Date Taking? Authorizing Provider  methocarbamol (ROBAXIN) 500 MG tablet Take 1 tablet (500 mg total) by mouth at bedtime as needed for muscle spasms. 10/13/20  Yes Chrisoula Zegarra, Britta Mccreedy, MD  predniSONE (DELTASONE) 10 MG tablet Take 3 tablets (30 mg total) by mouth daily for 5 days. 10/13/20 10/18/20 Yes Jazmina Muhlenkamp, Britta Mccreedy, MD    Family History Family History  Problem Relation Age of Onset   Cancer Maternal Grandfather    Diabetes Mother     Social History Social History   Tobacco Use    Smoking status: Never  Substance Use Topics   Alcohol use: No    Alcohol/week: 0.0 standard drinks   Drug use: No     Allergies   Patient has no known allergies.   Review of Systems Review of Systems  Constitutional: Negative.   Gastrointestinal:  Positive for abdominal pain.  Musculoskeletal:  Positive for back pain.  Skin: Negative.   Neurological: Negative.     Physical Exam Triage Vital Signs ED Triage Vitals  Enc Vitals Group     BP 10/13/20 1656 121/73     Pulse Rate 10/13/20 1656 82     Resp 10/13/20 1656 17     Temp 10/13/20 1656 98.4 F (36.9 C)     Temp Source 10/13/20 1656 Oral     SpO2 10/13/20 1656 100 %     Weight --      Height --      Head Circumference --      Peak Flow --      Pain Score 10/13/20 1657 8     Pain Loc --  Pain Edu? --      Excl. in GC? --    No data found.  Updated Vital Signs BP 121/73 (BP Location: Left Arm)   Pulse 82   Temp 98.4 F (36.9 C) (Oral)   Resp 17   LMP 09/11/2020   SpO2 100%   Visual Acuity Right Eye Distance:   Left Eye Distance:   Bilateral Distance:    Right Eye Near:   Left Eye Near:    Bilateral Near:     Physical Exam Vitals and nursing note reviewed.  Constitutional:      General: She is in acute distress.     Appearance: She is not ill-appearing.  Cardiovascular:     Rate and Rhythm: Normal rate and regular rhythm.     Comments: Tenderness on palpation of the paraspinal muscles in the sacral area.  Rovsing sign is negative.  Straight leg test is positive.  Abdomen is soft without guarding or rebound tenderness. Abdominal:     General: Abdomen is flat. Bowel sounds are normal.     Palpations: Abdomen is soft.     Tenderness: There is no abdominal tenderness.     Hernia: No hernia is present.  Skin:    General: Skin is warm.  Neurological:     Mental Status: She is alert.     UC Treatments / Results  Labs (all labs ordered are listed, but only abnormal results are  displayed) Labs Reviewed - No data to display  EKG   Radiology No results found.  Procedures Procedures (including critical care time)  Medications Ordered in UC Medications  ketorolac (TORADOL) 30 MG/ML injection 30 mg (30 mg Intravenous Given 10/13/20 1730)    Initial Impression / Assessment and Plan / UC Course  I have reviewed the triage vital signs and the nursing notes.  Pertinent labs & imaging results that were available during my care of the patient were reviewed by me and considered in my medical decision making (see chart for details).     1.  Right-sided sciatica: Toradol 30 mg IM x1 dose-patient's last menstrual period was 3 weeks ago and she believes that she is not pregnant. Prednisone 30 mg orally daily for 5 days Back strengthening exercises Tylenol as needed for pain because the patient is on steroids. Robaxin as needed for muscle spasms Return precautions given. Final Clinical Impressions(s) / UC Diagnoses   Final diagnoses:  Right sided sciatica     Discharge Instructions      Please take medications as prescribed Back strengthening exercises Heating pad on a 15 minutes on-15 minutes off cycle Return to urgent care if symptoms worsen.   ED Prescriptions     Medication Sig Dispense Auth. Provider   methocarbamol (ROBAXIN) 500 MG tablet Take 1 tablet (500 mg total) by mouth at bedtime as needed for muscle spasms. 20 tablet Debbie Bellucci, Britta Mccreedy, MD   predniSONE (DELTASONE) 10 MG tablet Take 3 tablets (30 mg total) by mouth daily for 5 days. 15 tablet Aalani Aikens, Britta Mccreedy, MD      PDMP not reviewed this encounter.   Merrilee Jansky, MD 10/13/20 1759    Merrilee Jansky, MD 10/13/20 [phone]

## 2021-03-29 ENCOUNTER — Encounter (HOSPITAL_COMMUNITY): Payer: Self-pay | Admitting: Emergency Medicine

## 2021-03-29 ENCOUNTER — Ambulatory Visit (HOSPITAL_COMMUNITY)
Admission: EM | Admit: 2021-03-29 | Discharge: 2021-03-29 | Disposition: A | Discharge status: Home / Self Care | Payer: Self-pay | Attending: Physician Assistant | Admitting: Physician Assistant

## 2021-03-29 ENCOUNTER — Other Ambulatory Visit: Payer: Self-pay

## 2021-03-29 DIAGNOSIS — R051 Acute cough: Secondary | ICD-10-CM

## 2021-03-29 DIAGNOSIS — Z20822 Contact with and (suspected) exposure to covid-19: Secondary | ICD-10-CM | POA: Insufficient documentation

## 2021-03-29 DIAGNOSIS — J069 Acute upper respiratory infection, unspecified: Secondary | ICD-10-CM

## 2021-03-29 DIAGNOSIS — R109 Unspecified abdominal pain: Secondary | ICD-10-CM | POA: Insufficient documentation

## 2021-03-29 DIAGNOSIS — R197 Diarrhea, unspecified: Secondary | ICD-10-CM

## 2021-03-29 DIAGNOSIS — R0981 Nasal congestion: Secondary | ICD-10-CM

## 2021-03-29 LAB — POC INFLUENZA A AND B ANTIGEN (URGENT CARE ONLY)
INFLUENZA A ANTIGEN, POC: NEGATIVE
INFLUENZA B ANTIGEN, POC: NEGATIVE

## 2021-03-29 LAB — POCT RAPID STREP A, ED / UC: Streptococcus, Group A Screen (Direct): NEGATIVE

## 2021-03-29 LAB — SARS CORONAVIRUS 2 (TAT 6-24 HRS): SARS Coronavirus 2: NEGATIVE

## 2021-03-29 MED ORDER — FLUTICASONE PROPIONATE 50 MCG/ACT NA SUSP: 1.0000 | Freq: Every day | NASAL | 0 refills | Status: DC

## 2021-03-29 MED ORDER — CETIRIZINE HCL 10 MG PO TABS: 10.0000 mg | ORAL_TABLET | Freq: Every day | ORAL | 0 refills | Status: DC

## 2021-03-29 MED ORDER — PROMETHAZINE-DM 6.25-15 MG/5ML PO SYRP: 5.0000 mL | ORAL_SOLUTION | Freq: Two times a day (BID) | ORAL | 0 refills | Status: DC | PRN

## 2021-03-29 NOTE — Discharge Instructions (Signed)
I believe that she has a virus.  Her flu test and strep test were negative.  We will contact you if your COVID test is positive.  Use Zyrtec for nasal congestion as well as Flonase.  Use promethazine DM for cough.  This can make you sleepy so do not drive or drink alcohol taking it.  Also recommend use Mucinex which is available over-the-counter.  Make sure you are drinking plenty of fluid.  If your symptoms are not improving by next week please return here or see your PCP.  If you develop any worsening symptoms including high fever, chest pain, shortness of breath, severe abdominal pain, nausea/vomiting interfering with oral intake, worsening diarrhea, weakness you need to go to the emergency room.

## 2021-03-29 NOTE — ED Provider Notes (Signed)
[place]    CSN: [id number] Arrival date & time: 03/29/21  1114      History   Chief Complaint Chief Complaint  Patient presents with   Abdominal Pain   URI    HPI Alisha Hall is a 41 y.o. female.   Patient presents today with a 5-day history of sore throat.  Reports that this is since improved but she has developed additional symptoms including nasal congestion, abdominal pain, fatigue, diarrhea.  Denies any chest pain, shortness of breath, severe cough, nausea/vomiting.  She has tried ibuprofen without improvement of symptoms.  Reports someone in the house had strep approximately 2 weeks ago but denies additional known sick contacts.  She denies history of allergies or asthma but did have chronic sinusitis several years ago and current symptoms are similar to previous episodes of this condition.  She denies any recent antibiotic use.  She is confident that she is not pregnant.  She denies history of smoking.  She has not had COVID in the past.   Past Medical History:  Diagnosis Date   Medical history non-contributory     Patient Active Problem List   Diagnosis Date Noted   NVD (normal vaginal delivery) 04/16/2013    Past Surgical History:  Procedure Laterality Date   NO PAST SURGERIES      OB History     Gravida  4   Para  4   Term  3   Preterm  1   AB      Living  4      SAB      IAB      Ectopic      Multiple      Live Births  4            Home Medications    Prior to Admission medications   Medication Sig Start Date End Date Taking? Authorizing Provider  cetirizine (ZYRTEC ALLERGY) 10 MG tablet Take 1 tablet (10 mg total) by mouth at bedtime. 03/29/21  Yes Brenten Janney K, PA-C  fluticasone (FLONASE) 50 MCG/ACT nasal spray Place 1 spray into both nostrils daily. 03/29/21  Yes Jagar Lua K, PA-C  promethazine-dextromethorphan (PROMETHAZINE-DM) 6.25-15 MG/5ML syrup Take 5 mLs by mouth 2 (two) times daily as  needed for cough. 03/29/21  Yes Davidjames Blansett K, PA-C  methocarbamol (ROBAXIN) 500 MG tablet Take 1 tablet (500 mg total) by mouth at bedtime as needed for muscle spasms. 10/13/20   Chase Picket, MD    Family History Family History  Problem Relation Age of Onset   Cancer Maternal Grandfather    Diabetes Mother     Social History Social History   Tobacco Use   Smoking status: Never  Substance Use Topics   Alcohol use: No    Alcohol/week: 0.0 standard drinks   Drug use: No     Allergies   Patient has no known allergies.   Review of Systems Review of Systems  Constitutional:  Positive for activity change, appetite change and fatigue. Negative for fever.  HENT:  Positive for congestion and sore throat. Negative for sinus pressure and sneezing.   Respiratory:  Negative for cough and shortness of breath.   Cardiovascular:  Negative for chest pain.  Gastrointestinal:  Positive for abdominal pain and diarrhea. Negative for nausea and vomiting.  Musculoskeletal:  Negative for arthralgias and myalgias.  Neurological:  Positive for headaches. Negative for dizziness and light-headedness.    Physical Exam Triage Vital Signs  ED Triage Vitals  Enc Vitals Group     BP 03/29/21 1324 121/81     Pulse Rate 03/29/21 1324 70     Resp 03/29/21 1324 18     Temp 03/29/21 1324 98.3 F (36.8 C)     Temp Source 03/29/21 1324 Oral     SpO2 03/29/21 1324 97 %     Weight 03/29/21 1322 145 lb 11.6 oz (66.1 kg)     Height 03/29/21 1322 5' 0.5" (1.537 m)     Head Circumference --      Peak Flow --      Pain Score 03/29/21 1322 7     Pain Loc --      Pain Edu? --      Excl. in [place]? --    No data found.  Updated Vital Signs BP 121/81 (BP Location: Right Arm)    Pulse 70    Temp 98.3 F (36.8 C) (Oral)    Resp 18    Ht 5' 0.5" (1.537 m)    Wt 145 lb 11.6 oz (66.1 kg)    SpO2 97%    BMI 27.99 kg/m   Visual Acuity Right Eye Distance:   Left Eye Distance:   Bilateral Distance:     Right Eye Near:   Left Eye Near:    Bilateral Near:     Physical Exam Vitals reviewed.  Constitutional:      General: She is awake. She is not in acute distress.    Appearance: Normal appearance. She is well-developed. She is not ill-appearing.     Comments: Very pleasant female appears stated age in no acute distress sitting comfortably in exam room  HENT:     Head: Normocephalic and atraumatic.     Right Ear: Tympanic membrane, ear canal and external ear normal. Tympanic membrane is not erythematous or bulging.     Left Ear: Tympanic membrane, ear canal and external ear normal. Tympanic membrane is not erythematous or bulging.     Nose:     Right Sinus: Maxillary sinus tenderness and frontal sinus tenderness present.     Left Sinus: Maxillary sinus tenderness and frontal sinus tenderness present.     Mouth/Throat:     Pharynx: Uvula midline. Posterior oropharyngeal erythema present. No oropharyngeal exudate.     Tonsils: No tonsillar exudate or tonsillar abscesses.  Cardiovascular:     Rate and Rhythm: Normal rate and regular rhythm.     Heart sounds: Normal heart sounds, S1 normal and S2 normal. No murmur heard. Pulmonary:     Effort: Pulmonary effort is normal.     Breath sounds: Normal breath sounds. No wheezing, rhonchi or rales.     Comments: Clear to auscultation bilaterally Abdominal:     General: Bowel sounds are normal.     Palpations: Abdomen is soft.     Tenderness: There is no abdominal tenderness.     Comments: Benign abdominal exam  Lymphadenopathy:     Head:     Right side of head: No submental, submandibular or tonsillar adenopathy.     Left side of head: No submental, submandibular or tonsillar adenopathy.     Cervical: No cervical adenopathy.  Psychiatric:        Behavior: Behavior is cooperative.     UC Treatments / Results  Labs (all labs ordered are listed, but only abnormal results are displayed) Labs Reviewed  CULTURE, GROUP A STREP ([place])   SARS CORONAVIRUS 2 (TAT 6-24 HRS)  POCT RAPID  STREP A, ED / UC  POC INFLUENZA A AND B ANTIGEN (URGENT CARE ONLY)    EKG   Radiology No results found.  Procedures Procedures (including critical care time)  Medications Ordered in UC Medications - No data to display  Initial Impression / Assessment and Plan / UC Course  I have reviewed the triage vital signs and the nursing notes.  Pertinent labs & imaging results that were available during my care of the patient were reviewed by me and considered in my medical decision making (see chart for details).     Vital signs and physical exam reassuring today; no indication for emergent evaluation or imaging.  Flu and strep testing were negative in clinic.  Throat culture is pending.  Will defer antibiotics until throat culture results are obtained.  COVID testing was obtained.  She was instructed to remain in isolation until results are obtained and was given work excuse note with current CDC return to work guidelines based on COVID test result.  We will treat symptomatically with Promethazine DM for cough with instruction not to drive or drink alcohol with taking this medication.  She was given Flonase and Zyrtec for congestion symptoms.  Recommended Mucinex for additional symptom relief.  She is to rest and drink plenty of fluid.  Recommended follow-up with PCP next week if symptoms have not resolved.  Discussed that if anything worsens and she develops severe abdominal pain, chest pain, shortness of breath, nausea/vomiting interfering with oral intake, worsening diarrhea or blood in her stool, weakness she needs to go to emergency room immediately.  Strict return precautions given to which she expressed understanding.  Final Clinical Impressions(s) / UC Diagnoses   Final diagnoses:  Upper respiratory tract infection, unspecified type  Nasal congestion  Acute cough  Diarrhea, unspecified type     Discharge Instructions      I believe  that she has a virus.  Her flu test and strep test were negative.  We will contact you if your COVID test is positive.  Use Zyrtec for nasal congestion as well as Flonase.  Use promethazine DM for cough.  This can make you sleepy so do not drive or drink alcohol taking it.  Also recommend use Mucinex which is available over-the-counter.  Make sure you are drinking plenty of fluid.  If your symptoms are not improving by next week please return here or see your PCP.  If you develop any worsening symptoms including high fever, chest pain, shortness of breath, severe abdominal pain, nausea/vomiting interfering with oral intake, worsening diarrhea, weakness you need to go to the emergency room.     ED Prescriptions     Medication Sig Dispense Auth. Provider   cetirizine (ZYRTEC ALLERGY) 10 MG tablet Take 1 tablet (10 mg total) by mouth at bedtime. 30 tablet Adrain Butrick K, PA-C   fluticasone (FLONASE) 50 MCG/ACT nasal spray Place 1 spray into both nostrils daily. 16 g Bertine Schlottman K, PA-C   promethazine-dextromethorphan (PROMETHAZINE-DM) 6.25-15 MG/5ML syrup Take 5 mLs by mouth 2 (two) times daily as needed for cough. 118 mL Audra Kagel K, PA-C      PDMP not reviewed this encounter.   Terrilee Croak, PA-C 03/29/21 1501

## 2021-03-29 NOTE — ED Triage Notes (Signed)
Pt reports sore throat x 5 days that progressed into abdominal pain, nasal congestion, and diarrhea x 1 day.

## 2021-04-03 LAB — CULTURE, GROUP A STREP (THRC)

## 2021-11-25 ENCOUNTER — Encounter (HOSPITAL_COMMUNITY): Payer: Self-pay

## 2021-11-25 ENCOUNTER — Ambulatory Visit (INDEPENDENT_AMBULATORY_CARE_PROVIDER_SITE_OTHER): Payer: Self-pay

## 2021-11-25 ENCOUNTER — Ambulatory Visit (HOSPITAL_COMMUNITY)
Admission: RE | Admit: 2021-11-25 | Discharge: 2021-11-25 | Disposition: A | Discharge status: Home / Self Care | Payer: Self-pay | Source: Ambulatory Visit | Attending: Emergency Medicine | Admitting: Emergency Medicine

## 2021-11-25 VITALS — BP 126/72 | HR 77 | Temp 97.6°F | Resp 12

## 2021-11-25 DIAGNOSIS — M6790 Unspecified disorder of synovium and tendon, unspecified site: Secondary | ICD-10-CM

## 2021-11-25 DIAGNOSIS — M25531 Pain in right wrist: Secondary | ICD-10-CM

## 2021-11-25 MED ORDER — NAPROXEN 500 MG PO TABS: 500.0000 mg | ORAL_TABLET | Freq: Two times a day (BID) | ORAL | 0 refills | Status: DC

## 2021-11-25 NOTE — ED Triage Notes (Signed)
Pt injured right wrist  x3 wks ago, pt states pain radiates to her arm, unable to move  right wrist around , pain in the palm of her hand as well, at night hand feels cold .

## 2021-11-25 NOTE — Discharge Instructions (Addendum)
I am treating you for tendinopathy in your right wrist.  This is caused by inflammation tendon which could be brought on by your job and constant use of the same wrist.  I have given you a wrist brace to help with compression of the area.  Also sent naproxen to the pharmacy, this is a nonsteroidal anti-inflammatory drug that can help with the inflammation of the tendon.  Take this medication 2 times daily with at least 12 hours between each dose.  Please make sure to not take NSAID on an empty stomach, NSAIDs can cause GI side effects including GI bleed.   I have attached information for [facility] if symptoms do not improve please go to [facility].

## 2021-11-25 NOTE — ED Provider Notes (Addendum)
[facility]    CSN: [id number] Arrival date & time: 11/25/21  1801      History   Chief Complaint Chief Complaint  Patient presents with   Wrist Injury    HPI Alisha Hall is a 41 y.o. female.  Patient complaining of right wrist pain that started 3 weeks ago.  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  Patient states pain is worsened by movement or attempting to clean.  Patient has resorted to using her left hand for work. Patient endorses pain improved with compression but has now worsened and compression is no longer helping.  She states she has a burning and tingling sensation on the palmar surface of her hand at times.  Patient has used some over-the-counter topical medication with no relief of symptoms.  She denies any previous injury to right wrist or right hand.  Patient is Spanish-speaking.    Wrist Injury   Past Medical History:  Diagnosis Date   Medical history non-contributory     Patient Active Problem List   Diagnosis Date Noted   NVD (normal vaginal delivery) 04/16/2013    Past Surgical History:  Procedure Laterality Date   NO PAST SURGERIES      OB History     Gravida  4   Para  4   Term  3   Preterm  1   AB      Living  4      SAB      IAB      Ectopic      Multiple      Live Births  4            Home Medications    Prior to Admission medications   Medication Sig Start Date End Date Taking? Authorizing Provider  naproxen (NAPROSYN) 500 MG tablet Take 1 tablet (500 mg total) by mouth 2 (two) times daily. 11/25/21  Yes Debby Freiberg, NP  cetirizine (ZYRTEC ALLERGY) 10 MG tablet Take 1 tablet (10 mg total) by mouth at bedtime. 03/29/21   Raspet, Denny Peon K, PA-C  fluticasone (FLONASE) 50 MCG/ACT nasal spray Place 1 spray into both nostrils daily. 03/29/21   Raspet, Noberto Retort, PA-C  methocarbamol (ROBAXIN) 500 MG tablet Take 1 tablet (500 mg total) by  mouth at bedtime as needed for muscle spasms. 10/13/20   Merrilee Jansky, MD  promethazine-dextromethorphan (PROMETHAZINE-DM) 6.25-15 MG/5ML syrup Take 5 mLs by mouth 2 (two) times daily as needed for cough. 03/29/21   Raspet, Noberto Retort, PA-C    Family History Family History  Problem Relation Age of Onset   Cancer Maternal Grandfather    Diabetes Mother     Social History Social History   Tobacco Use   Smoking status: Never  Substance Use Topics   Alcohol use: No    Alcohol/week: 0.0 standard drinks of alcohol   Drug use: No     Allergies   Patient has no known allergies.   Review of Systems Review of Systems  Constitutional:  Positive for activity change (Difficulty using right hand for her occupation).  Musculoskeletal:  Positive for arthralgias (RT wrist) and joint swelling. Negative for myalgias.     Physical Exam Triage Vital Signs ED Triage Vitals [11/25/21 1810]  Enc Vitals Group     BP      Pulse      Resp      Temp      Temp  src      SpO2      Weight      Height      Head Circumference      Peak Flow      Pain Score 8     Pain Loc      Pain Edu?      Excl. in [place]?    No data found.  Updated Vital Signs BP 126/72 (BP Location: Left Arm)   Pulse 77   Temp 97.6 F (36.4 C)   Resp 12   LMP 11/22/2021   SpO2 98%      Physical Exam Cardiovascular:     Pulses:          Radial pulses are 2+ on the right side and 2+ on the left side.  Musculoskeletal:     Right wrist: Tenderness (Tenderness upon volar side of wrist along medial side is greater tenderness.), bony tenderness and snuff box tenderness present. No swelling, deformity or effusion. Decreased range of motion. Normal pulse.     Left wrist: Normal.     Comments: Positive Finkelstein test for RT wrist.   Skin:    General: Skin is warm.     Capillary Refill: Capillary refill takes less than 2 seconds.     Comments: Normal cap refill in RT and LFT hand       UC Treatments / Results   Labs (all labs ordered are listed, but only abnormal results are displayed) Labs Reviewed - No data to display  EKG   Radiology DG Wrist Complete Right  Result Date: 11/25/2021 CLINICAL DATA:  Wrist pain and decreased range of motion. Tenderness. Injury 3 weeks ago. EXAM: RIGHT WRIST - COMPLETE 3+ VIEW COMPARISON:  None Available. FINDINGS: There is no evidence of fracture or dislocation. Normal alignment. Normal joint spaces. There is no evidence of arthropathy or other focal bone abnormality. Soft tissues are unremarkable. IMPRESSION: Negative radiographs of the right wrist. Electronically Signed   By: Keith Rake M.D.   On: 11/25/2021 18:45    Procedures Procedures (including critical care time)  Medications Ordered in UC Medications - No data to display  Initial Impression / Assessment and Plan / UC Course  I have reviewed the triage vital signs and the nursing notes.  Pertinent labs & imaging results that were available during my care of the patient were reviewed by me and considered in my medical decision making (see chart for details).     Patient was diagnosed with tendinopathy of the right wrist.  X-ray was performed in office which showed negative result.  Patient was given a wrist brace to aid in compression of the site.  She was sent a prescription for naproxen to help with pain and inflammation.  Patient was given instructions on possible side effects of NSAIDs.  Patient was given information for [facility] if symptoms do not improve or worsen.  She verbalized understanding of instructions. Final Clinical Impressions(s) / UC Diagnoses   Final diagnoses:  Tendinopathy     Discharge Instructions      I am treating you for tendinopathy in your right wrist.  This is caused by inflammation tendon which could be brought on by your job and constant use of the same wrist.  I have given you a wrist brace to help with compression of the area.  Also sent naproxen to the  pharmacy, this is a nonsteroidal anti-inflammatory drug that can help with the inflammation of the tendon.  Take this  medication 2 times daily with at least 12 hours between each dose.  Please make sure to not take NSAID on an empty stomach, NSAIDs can cause GI side effects including GI bleed.   I have attached information for [facility] if symptoms do not improve please go to [facility].      ED Prescriptions     Medication Sig Dispense Auth. Provider   naproxen (NAPROSYN) 500 MG tablet Take 1 tablet (500 mg total) by mouth 2 (two) times daily. 30 tablet Debby Freiberg, NP      PDMP not reviewed this encounter.   Debby Freiberg, NP 11/25/21 1914    Debby Freiberg, NP 11/25/21 1915

## 2021-11-26 ENCOUNTER — Ambulatory Visit (HOSPITAL_COMMUNITY): Payer: Self-pay

## 2022-02-03 ENCOUNTER — Ambulatory Visit (HOSPITAL_COMMUNITY)
Admission: RE | Admit: 2022-02-03 | Discharge: 2022-02-03 | Disposition: A | Discharge status: Home / Self Care | Payer: Self-pay | Source: Ambulatory Visit | Attending: Physician Assistant | Admitting: Physician Assistant

## 2022-02-03 ENCOUNTER — Encounter (HOSPITAL_COMMUNITY): Payer: Self-pay

## 2022-02-03 VITALS — BP 133/77 | HR 82 | Temp 98.2°F | Resp 16

## 2022-02-03 DIAGNOSIS — H1012 Acute atopic conjunctivitis, left eye: Secondary | ICD-10-CM

## 2022-02-03 DIAGNOSIS — J302 Other seasonal allergic rhinitis: Secondary | ICD-10-CM

## 2022-02-03 MED ORDER — FLUTICASONE PROPIONATE 50 MCG/ACT NA SUSP: 1.0000 | Freq: Every day | NASAL | 0 refills | Status: DC

## 2022-02-03 MED ORDER — CETIRIZINE HCL 10 MG PO TABS: 10.0000 mg | ORAL_TABLET | Freq: Every day | ORAL | 2 refills | Status: DC

## 2022-02-03 MED ORDER — OLOPATADINE HCL 0.1 % OP SOLN: 1.0000 [drp] | Freq: Two times a day (BID) | OPHTHALMIC | 1 refills | Status: DC

## 2022-02-03 NOTE — ED Triage Notes (Signed)
Pt states eye redness for the past 3 days. States sometimes it is itchy and painful.

## 2022-02-03 NOTE — ED Provider Notes (Signed)
[place]    CSN: [id number] Arrival date & time: 02/03/22  [place]      History   Chief Complaint Chief Complaint  Patient presents with   Conjunctivitis    HPI Alisha Hall is a 41 y.o. female.   Patient presents today companied by her husband help provide the history and translation.  Reports that for the past 6 weeks she has had intermittent erythema and itching/burning in her left eye.  She does not wear glasses or contacts.  Denies any visual disturbance, ocular pain, fever, headache, nausea, vomiting.  Denies any recent illness.  Denies any known sick contacts including anyone with pinkeye.  She has not tried any over-the-counter medication for symptom management.  She does have allergies but has not been taking cetirizine or Flonase regularly.  She does report that 3 to 4 months ago she got a small amount of bleach in her eye but rinsed following exposure and did not have ongoing symptoms.  Denies any additional chemical exposure or from particulate matter.  She has not seen an ophthalmologist.  Denies any significant overnight drainage.    Past Medical History:  Diagnosis Date   Medical history non-contributory     Patient Active Problem List   Diagnosis Date Noted   NVD (normal vaginal delivery) 04/16/2013    Past Surgical History:  Procedure Laterality Date   NO PAST SURGERIES      OB History     Gravida  4   Para  4   Term  3   Preterm  1   AB      Living  4      SAB      IAB      Ectopic      Multiple      Live Births  4            Home Medications    Prior to Admission medications   Medication Sig Start Date End Date Taking? Authorizing Provider  olopatadine (PATADAY) 0.1 % ophthalmic solution Place 1 drop into both eyes 2 (two) times daily. 02/03/22  Yes Zade Falkner, Derry Skill, PA-C  cetirizine (ZYRTEC ALLERGY) 10 MG tablet Take 1 tablet (10 mg total) by mouth at bedtime. 02/03/22   Ameia Morency, Junie Panning K, PA-C   fluticasone (FLONASE) 50 MCG/ACT nasal spray Place 1 spray into both nostrils daily. 02/03/22   Brailyn Killion, Derry Skill, PA-C  methocarbamol (ROBAXIN) 500 MG tablet Take 1 tablet (500 mg total) by mouth at bedtime as needed for muscle spasms. 10/13/20   Lamptey, Myrene Galas, MD  naproxen (NAPROSYN) 500 MG tablet Take 1 tablet (500 mg total) by mouth 2 (two) times daily. 11/25/21   Flossie Dibble, NP  promethazine-dextromethorphan (PROMETHAZINE-DM) 6.25-15 MG/5ML syrup Take 5 mLs by mouth 2 (two) times daily as needed for cough. 03/29/21   Tylyn Stankovich, Derry Skill, PA-C    Family History Family History  Problem Relation Age of Onset   Cancer Maternal Grandfather    Diabetes Mother     Social History Social History   Tobacco Use   Smoking status: Never  Substance Use Topics   Alcohol use: No    Alcohol/week: 0.0 standard drinks of alcohol   Drug use: No     Allergies   Patient has no known allergies.   Review of Systems Review of Systems  Constitutional:  Negative for activity change, appetite change, fatigue and fever.  HENT:  Positive for congestion and rhinorrhea. Negative for sinus  pressure, sneezing and sore throat.   Eyes:  Positive for redness and itching. Negative for photophobia, pain, discharge and visual disturbance.  Respiratory:  Negative for cough and shortness of breath.   Cardiovascular:  Negative for chest pain.  Gastrointestinal:  Negative for abdominal pain, diarrhea, nausea and vomiting.  Neurological:  Negative for dizziness, light-headedness and headaches.     Physical Exam Triage Vital Signs ED Triage Vitals  Enc Vitals Group     BP 02/03/22 1851 133/77     Pulse Rate 02/03/22 1851 82     Resp 02/03/22 1851 16     Temp 02/03/22 1851 98.2 F (36.8 C)     Temp src --      SpO2 02/03/22 1851 99 %     Weight --      Height --      Head Circumference --      Peak Flow --      Pain Score 02/03/22 1852 2     Pain Loc --      Pain Edu? --      Excl. in [place]? --     No data found.  Updated Vital Signs BP 133/77 (BP Location: Left Arm)   Pulse 82   Temp 98.2 F (36.8 C)   Resp 16   LMP 01/20/2022 (Approximate)   SpO2 99%   Visual Acuity Right Eye Distance: 20/25 Left Eye Distance: 20/25 Bilateral Distance: 20/25  Right Eye Near:   Left Eye Near:    Bilateral Near:     Physical Exam Vitals reviewed.  Constitutional:      General: She is awake. She is not in acute distress.    Appearance: Normal appearance. She is well-developed. She is not ill-appearing.     Comments: Very pleasant female appears to stated age no acute distress sitting comfortably in exam room  HENT:     Head: Normocephalic and atraumatic.     Right Ear: External ear normal.     Left Ear: External ear normal.     Mouth/Throat:     Pharynx: Uvula midline. No oropharyngeal exudate or posterior oropharyngeal erythema.  Eyes:     Extraocular Movements: Extraocular movements intact.     Conjunctiva/sclera:     Right eye: Right conjunctiva is not injected. No chemosis.    Left eye: Left conjunctiva is not injected. No chemosis.    Pupils: Pupils are equal, round, and reactive to light.  Cardiovascular:     Rate and Rhythm: Normal rate and regular rhythm.     Heart sounds: Normal heart sounds, S1 normal and S2 normal. No murmur heard. Pulmonary:     Effort: Pulmonary effort is normal.     Breath sounds: Normal breath sounds. No wheezing, rhonchi or rales.     Comments: Clear to auscultation bilaterally Psychiatric:        Behavior: Behavior is cooperative.      UC Treatments / Results  Labs (all labs ordered are listed, but only abnormal results are displayed) Labs Reviewed - No data to display  EKG   Radiology No results found.  Procedures Procedures (including critical care time)  Medications Ordered in UC Medications - No data to display  Initial Impression / Assessment and Plan / UC Course  I have reviewed the triage vital signs and the nursing  notes.  Pertinent labs & imaging results that were available during my care of the patient were reviewed by me and considered in my medical decision making (  see chart for details).     Patient is well-appearing, afebrile, nontoxic, nontachycardic.  No evidence of bacterial etiology.  Fluorescein staining was deferred as she denies any ocular pain, foreign body sensation, recent injury.  Concern for allergic conjunctivitis given intermittent symptoms and clinical presentation.  Recommended she continue her previously prescribed allergy medicine and refills of cetirizine and Flonase were sent to pharmacy.  She was started on Pataday drops as needed for additional symptom relief.  She can use lubricating eyedrops such as Systane as needed for additional symptom relief.  Discussed that if her symptoms or not improving or if she has any worsening symptoms including eye pain, visual disturbance, photophobia she should follow-up with an ophthalmologist and was given contact information for local provider.  Strict return precautions given.  Final Clinical Impressions(s) / UC Diagnoses   Final diagnoses:  Allergic conjunctivitis of left eye  Seasonal allergies     Discharge Instructions      I believe your symptoms are related to allergies.  Please restart your cetirizine daily and Flonase.  Start Pataday 1 drop in each eye twice daily as needed.  You can use lubricating eyedrops/artificial tears for additional symptom relief.  If your symptoms or not improving please follow-up with ophthalmology; call to schedule an appointment.  If anything worsens and you have vision change, eye pain, fever, nausea, vomiting you need to be seen immediately.     ED Prescriptions     Medication Sig Dispense Auth. Provider   cetirizine (ZYRTEC ALLERGY) 10 MG tablet Take 1 tablet (10 mg total) by mouth at bedtime. 30 tablet Kenyona Rena K, PA-C   fluticasone (FLONASE) 50 MCG/ACT nasal spray Place 1 spray into both  nostrils daily. 16 g Inesha Sow K, PA-C   olopatadine (PATADAY) 0.1 % ophthalmic solution Place 1 drop into both eyes 2 (two) times daily. 5 mL Maghan Jessee K, PA-C      PDMP not reviewed this encounter.   Alisha Hawking, PA-C 02/03/22 1923

## 2022-02-03 NOTE — Discharge Instructions (Signed)
I believe your symptoms are related to allergies.  Please restart your cetirizine daily and Flonase.  Start Pataday 1 drop in each eye twice daily as needed.  You can use lubricating eyedrops/artificial tears for additional symptom relief.  If your symptoms or not improving please follow-up with ophthalmology; call to schedule an appointment.  If anything worsens and you have vision change, eye pain, fever, nausea, vomiting you need to be seen immediately.

## 2023-04-27 ENCOUNTER — Ambulatory Visit (HOSPITAL_COMMUNITY)
Admission: RE | Admit: 2023-04-27 | Discharge: 2023-04-27 | Disposition: A | Discharge status: Home / Self Care | Payer: Self-pay | Source: Ambulatory Visit | Attending: Emergency Medicine | Admitting: Emergency Medicine

## 2023-04-27 ENCOUNTER — Ambulatory Visit (INDEPENDENT_AMBULATORY_CARE_PROVIDER_SITE_OTHER): Payer: Self-pay

## 2023-04-27 ENCOUNTER — Encounter (HOSPITAL_COMMUNITY): Payer: Self-pay

## 2023-04-27 VITALS — BP 110/73 | HR 90 | Temp 98.4°F | Resp 16

## 2023-04-27 DIAGNOSIS — J019 Acute sinusitis, unspecified: Secondary | ICD-10-CM

## 2023-04-27 DIAGNOSIS — R051 Acute cough: Secondary | ICD-10-CM

## 2023-04-27 DIAGNOSIS — J4521 Mild intermittent asthma with (acute) exacerbation: Secondary | ICD-10-CM

## 2023-04-27 MED ORDER — ALBUTEROL SULFATE HFA 108 (90 BASE) MCG/ACT IN AERS: 1.0000 | INHALATION_SPRAY | Freq: Four times a day (QID) | RESPIRATORY_TRACT | 0 refills | Status: DC | PRN

## 2023-04-27 MED ORDER — IPRATROPIUM-ALBUTEROL 0.5-2.5 (3) MG/3ML IN SOLN
3.0000 mL | Freq: Once | RESPIRATORY_TRACT | Status: AC
  Administered 2023-04-27: 3 mL via RESPIRATORY_TRACT

## 2023-04-27 MED ORDER — IPRATROPIUM-ALBUTEROL 0.5-2.5 (3) MG/3ML IN SOLN
RESPIRATORY_TRACT | Status: AC
  Filled 2023-04-27: qty 3

## 2023-04-27 MED ORDER — AMOXICILLIN-POT CLAVULANATE 875-125 MG PO TABS: 1.0000 | ORAL_TABLET | Freq: Two times a day (BID) | ORAL | 0 refills | Status: DC

## 2023-04-27 MED ORDER — PREDNISONE 20 MG PO TABS: 40.0000 mg | ORAL_TABLET | Freq: Every day | ORAL | 0 refills | Status: AC

## 2023-04-27 NOTE — ED Provider Notes (Signed)
 [facility]    CSN: [id number] Arrival date & time: 04/27/23  1723      History   Chief Complaint Chief Complaint  Patient presents with   Nasal Congestion    Flu like symptoms for 5 days - Entered by patient   Cough   Sore Throat    HPI Alisha Hall is a 43 y.o. female.   Patient presents with congestion, sinus pressure/pain, sore throat, cough, and fever x 1 week. Patient endorses chest pain with coughing and mild chest tightness with deep breathing. Denies shortness of breath, dizziness, nausea, vomiting, diarrhea, and abdominal pain.   Patient states that she had a fever up until about 2 days ago.   Denies history of asthma or COPD.    Cough Sore Throat    Past Medical History:  Diagnosis Date   Medical history non-contributory     Patient Active Problem List   Diagnosis Date Noted   NVD (normal vaginal delivery) 04/16/2013    Past Surgical History:  Procedure Laterality Date   NO PAST SURGERIES      OB History     Gravida  4   Para  4   Term  3   Preterm  1   AB      Living  4      SAB      IAB      Ectopic      Multiple      Live Births  4            Home Medications    Prior to Admission medications   Medication Sig Start Date End Date Taking? Authorizing Provider  albuterol (VENTOLIN HFA) 108 (90 Base) MCG/ACT inhaler Inhale 1-2 puffs into the lungs every 6 (six) hours as needed for wheezing or shortness of breath. 04/27/23  Yes Wynonia Lawman A, NP  amoxicillin-clavulanate (AUGMENTIN) 875-125 MG tablet Take 1 tablet by mouth every 12 (twelve) hours. 04/27/23  Yes Wynonia Lawman A, NP  cetirizine (ZYRTEC ALLERGY) 10 MG tablet Take 1 tablet (10 mg total) by mouth at bedtime. 02/03/22  Yes Raspet, Erin K, PA-C  predniSONE (DELTASONE) 20 MG tablet Take 2 tablets (40 mg total) by mouth daily for 5 days. 04/27/23 05/02/23 Yes Letta Kocher, NP    Family History Family History  Problem  Relation Age of Onset   Cancer Maternal Grandfather    Diabetes Mother     Social History Social History   Tobacco Use   Smoking status: Never  Substance Use Topics   Alcohol use: No    Alcohol/week: 0.0 standard drinks of alcohol   Drug use: No     Allergies   Patient has no known allergies.   Review of Systems Review of Systems  Respiratory:  Positive for cough.    Per HPI  Physical Exam Triage Vital Signs ED Triage Vitals [04/27/23 1745]  Encounter Vitals Group     BP 110/73     Systolic BP Percentile      Diastolic BP Percentile      Pulse Rate 90     Resp 16     Temp 98.4 F (36.9 C)     Temp Source Oral     SpO2 97 %     Weight      Height      Head Circumference      Peak Flow      Pain Score 0     Pain  Loc      Pain Education      Exclude from Growth Chart    No data found.  Updated Vital Signs BP 110/73 (BP Location: Left Arm)   Pulse 90   Temp 98.4 F (36.9 C) (Oral)   Resp 16   LMP 04/27/2023 (Approximate)   SpO2 97%   Visual Acuity Right Eye Distance:   Left Eye Distance:   Bilateral Distance:    Right Eye Near:   Left Eye Near:    Bilateral Near:     Physical Exam Vitals and nursing note reviewed.  Constitutional:      General: She is awake. She is not in acute distress.    Appearance: Normal appearance. She is well-developed and well-groomed. She is not ill-appearing.  HENT:     Right Ear: Tympanic membrane, ear canal and external ear normal.     Left Ear: Tympanic membrane, ear canal and external ear normal.     Nose: Congestion and rhinorrhea present.     Right Sinus: Maxillary sinus tenderness and frontal sinus tenderness present.     Mouth/Throat:     Mouth: Mucous membranes are moist.     Pharynx: Posterior oropharyngeal erythema present. No oropharyngeal exudate.  Cardiovascular:     Rate and Rhythm: Normal rate and regular rhythm.  Pulmonary:     Effort: Pulmonary effort is normal.     Breath sounds: Wheezing  present.  Musculoskeletal:        General: Normal range of motion.  Skin:    General: Skin is warm and dry.  Neurological:     Mental Status: She is alert.  Psychiatric:        Behavior: Behavior is cooperative.      UC Treatments / Results  Labs (all labs ordered are listed, but only abnormal results are displayed) Labs Reviewed - No data to display  EKG   Radiology DG Chest 2 View Result Date: 04/27/2023 CLINICAL DATA:  Cough EXAM: CHEST - 2 VIEW COMPARISON:  None Available. FINDINGS: No consolidation, pneumothorax or effusion. No edema. Normal cardiopericardial silhouette. IMPRESSION: No acute cardiopulmonary disease. Electronically Signed   By: Karen Kays M.D.   On: 04/27/2023 18:32    Procedures Procedures (including critical care time)  Medications Ordered in UC Medications  ipratropium-albuterol (DUONEB) 0.5-2.5 (3) MG/3ML nebulizer solution 3 mL (3 mLs Nebulization Given 04/27/23 1834)    Initial Impression / Assessment and Plan / UC Course  I have reviewed the triage vital signs and the nursing notes.  Pertinent labs & imaging results that were available during my care of the patient were reviewed by me and considered in my medical decision making (see chart for details).     Upon assessment congestion and rhinorrhea present, mild erythema noted to pharynx.  Right frontal and maxillary sinus tenderness noted.  Mild wheezing auscultated throughout all lung fields.  DuoNeb given with relief of wheezing and symptomatic probable relief. Based on my interpretation there is no active cardiopulmonary disease.  Radiology report confirmed this.  Prescribed Augmentin for sinusitis coverage.  Prescribed short course of steroids and an albuterol inhaler for exacerbation of underlying reactive airway disease.  Discussed return precautions. Final Clinical Impressions(s) / UC Diagnoses   Final diagnoses:  Acute cough  Exacerbation of reactive airway disease, mild  intermittent  Acute non-recurrent sinusitis, unspecified location     Discharge Instructions      Starting Augmentin twice daily for 7 days for sinus infection.  I have  also prescribed a short course of prednisone that you will take once daily for 5 days to help with signs infection as well as inflammation that is causing wheezing in your lungs.  I have prescribed an albuterol inhaler that you can use as needed every 6 hours for shortness of breath and wheezing.  You can continue taking the cold medication that you have been taking and using nasal spray as needed  Return here if symptoms persist.   Estoy empezando a tomar Augmentin dos veces al da durante 7 das para la sinusitis. Tambin le he recetado Scientist, clinical (histocompatibility and immunogenetics) de prednisona que tomar una vez al da durante 5 das para [employer] sntomas de la infeccin y la inflamacin que causa sibilancias en los pulmones.  Le he recetado Advertising account executive de albuterol que puede usar segn sea necesario cada 6 horas para la dificultad para respirar y las sibilancias.  Puede continuar [employer] para el resfriado que ha estado tomando y usar el espray nasal segn sea necesario.  Regrese aqu si los sntomas persisten.     ED Prescriptions     Medication Sig Dispense Auth. Provider   amoxicillin-clavulanate (AUGMENTIN) 875-125 MG tablet Take 1 tablet by mouth every 12 (twelve) hours. 14 tablet Susann Givens, Borden Thune A, NP   predniSONE (DELTASONE) 20 MG tablet Take 2 tablets (40 mg total) by mouth daily for 5 days. 10 tablet Wynonia Lawman A, NP   albuterol (VENTOLIN HFA) 108 (90 Base) MCG/ACT inhaler Inhale 1-2 puffs into the lungs every 6 (six) hours as needed for wheezing or shortness of breath. 6.7 g Wynonia Lawman A, NP      PDMP not reviewed this encounter.   Wynonia Lawman A, NP 04/27/23 2107

## 2023-04-27 NOTE — Discharge Instructions (Signed)
 Starting Augmentin twice daily for 7 days for sinus infection.  I have also prescribed a short course of prednisone that you will take once daily for 5 days to help with signs infection as well as inflammation that is causing wheezing in your lungs.  I have prescribed an albuterol inhaler that you can use as needed every 6 hours for shortness of breath and wheezing.  You can continue taking the cold medication that you have been taking and using nasal spray as needed  Return here if symptoms persist.   Estoy empezando a tomar Augmentin dos veces al da durante 7 das para la sinusitis. Tambin le he recetado Scientist, clinical (histocompatibility and immunogenetics) de prednisona que tomar una vez al da durante 5 das para [employer] sntomas de la infeccin y la inflamacin que causa sibilancias en los pulmones.  Le he recetado Advertising account executive de albuterol que puede usar segn sea necesario cada 6 horas para la dificultad para respirar y las sibilancias.  Puede continuar [employer] para el resfriado que ha estado tomando y usar el espray nasal segn sea necesario.  Regrese aqu si los sntomas persisten.

## 2023-04-27 NOTE — ED Triage Notes (Signed)
 Patient presents to office for cough,sore throat and nasal congestion x 1 week.

## 2023-05-09 ENCOUNTER — Ambulatory Visit (HOSPITAL_COMMUNITY)
Admission: EM | Admit: 2023-05-09 | Discharge: 2023-05-09 | Disposition: A | Discharge status: Home / Self Care | Payer: Self-pay | Attending: Internal Medicine | Admitting: Internal Medicine

## 2023-05-09 ENCOUNTER — Ambulatory Visit (INDEPENDENT_AMBULATORY_CARE_PROVIDER_SITE_OTHER): Payer: Self-pay

## 2023-05-09 ENCOUNTER — Encounter (HOSPITAL_COMMUNITY): Payer: Self-pay

## 2023-05-09 DIAGNOSIS — J069 Acute upper respiratory infection, unspecified: Secondary | ICD-10-CM | POA: Insufficient documentation

## 2023-05-09 DIAGNOSIS — R0602 Shortness of breath: Secondary | ICD-10-CM

## 2023-05-09 LAB — POC COVID19/FLU A&B COMBO
Covid Antigen, POC: NEGATIVE
Influenza A Antigen, POC: NEGATIVE
Influenza B Antigen, POC: NEGATIVE

## 2023-05-09 MED ORDER — GUAIFENESIN ER 1200 MG PO TB12: 1200.0000 mg | ORAL_TABLET | Freq: Two times a day (BID) | ORAL | 0 refills | Status: DC

## 2023-05-09 MED ORDER — PROMETHAZINE-DM 6.25-15 MG/5ML PO SYRP: 5.0000 mL | ORAL_SOLUTION | Freq: Every evening | ORAL | 0 refills | Status: DC | PRN

## 2023-05-09 MED ORDER — ACETAMINOPHEN 325 MG PO TABS
ORAL_TABLET | ORAL | Status: AC
  Filled 2023-05-09: qty 3

## 2023-05-09 MED ORDER — ACETAMINOPHEN 325 MG PO TABS
975.0000 mg | ORAL_TABLET | Freq: Once | ORAL | Status: AC
  Administered 2023-05-09: 975 mg via ORAL

## 2023-05-09 MED ORDER — BENZONATATE 100 MG PO CAPS: 100.0000 mg | ORAL_CAPSULE | Freq: Three times a day (TID) | ORAL | 0 refills | Status: DC

## 2023-05-09 MED ORDER — ONDANSETRON 4 MG PO TBDP: 4.0000 mg | ORAL_TABLET | Freq: Once | ORAL | Status: DC

## 2023-05-09 NOTE — Discharge Instructions (Addendum)
 You have a viral illness which will improve on its own with rest, fluids, and medications to help with your symptoms.  Tylenol, guaifenesin (plain mucinex), and saline nasal sprays may help relieve symptoms.   Albuterol inhaler every 4-6 hours as needed for cough, shortness of breath, and wheezing.   Two teaspoons of honey in 1 cup of warm water every 4-6 hours may help with throat pains.  Humidifier in room at nighttime may help soothe cough (clean well daily).   For chest pain, shortness of breath, inability to keep food or fluids down without vomiting, fever that does not respond to tylenol or motrin, or any other severe symptoms, please go to the ER for further evaluation. Return to urgent care as needed, otherwise follow-up with PCP.

## 2023-05-09 NOTE — ED Provider Notes (Signed)
 [facility]    CSN: [id number] Arrival date & time: 05/09/23  0848      History   Chief Complaint No chief complaint on file.   HPI Alisha Hall is a 43 y.o. female.   Alisha Hall is a 43 y.o. female presenting for chief complaint of cough, congestion, sore throat, headache, fatigue, and body aches that started yesterday. She feels like she has had a fever and has not checked temperature at home. She reports shortness of breath associated with cough. Cough is productive with clear phlegm. Denies chest pain, N/V/D, abdominal pain, dizziness, vision changes, ear pain, and leg swelling. No history of asthma/COPD. Never smoker. Son is sick with similar symptoms. She recently took Augmentin and Prednisone as prescribed for sinobronchitis starting 12 days ago on April 27, 2023. States symptoms fully resolved and then started again 1 day ago. She used albuterol inhaler prescribed for previous illness yesterday with relief of shortness of breath. She has not used inhaler this morning and has not attempted use of any other OTC medications for symptoms.   The history is provided by the patient. The history is limited by a language barrier. A language interpreter was used Medical laboratory scientific officer interpreter (video) used for entirety of patient encounter).    Past Medical History:  Diagnosis Date   Medical history non-contributory     Patient Active Problem List   Diagnosis Date Noted   NVD (normal vaginal delivery) 04/16/2013    Past Surgical History:  Procedure Laterality Date   NO PAST SURGERIES      OB History     Gravida  4   Para  4   Term  3   Preterm  1   AB      Living  4      SAB      IAB      Ectopic      Multiple      Live Births  4            Home Medications    Prior to Admission medications   Medication Sig Start Date End Date Taking? Authorizing Provider  albuterol (VENTOLIN HFA) 108 (90 Base) MCG/ACT  inhaler Inhale 1-2 puffs into the lungs every 6 (six) hours as needed for wheezing or shortness of breath. 04/27/23  Yes Susann Givens, Carley A, NP  benzonatate (TESSALON) 100 MG capsule Take 1 capsule (100 mg total) by mouth every 8 (eight) hours. 05/09/23  Yes Carlisle Beers, FNP  cetirizine (ZYRTEC ALLERGY) 10 MG tablet Take 1 tablet (10 mg total) by mouth at bedtime. 02/03/22  Yes Raspet, Erin K, PA-C  Guaifenesin 1200 MG TB12 Take 1 tablet (1,200 mg total) by mouth in the morning and at bedtime. 05/09/23  Yes Carlisle Beers, FNP  promethazine-dextromethorphan (PROMETHAZINE-DM) 6.25-15 MG/5ML syrup Take 5 mLs by mouth at bedtime as needed for cough. 05/09/23  Yes Carlisle Beers, FNP  amoxicillin-clavulanate (AUGMENTIN) 875-125 MG tablet Take 1 tablet by mouth every 12 (twelve) hours. 04/27/23   Letta Kocher, NP    Family History Family History  Problem Relation Age of Onset   Cancer Maternal Grandfather    Diabetes Mother     Social History Social History   Tobacco Use   Smoking status: Never  Substance Use Topics   Alcohol use: No    Alcohol/week: 0.0 standard drinks of alcohol   Drug use: No     Allergies   Patient has no  known allergies.   Review of Systems Review of Systems Per HPI  Physical Exam Triage Vital Signs ED Triage Vitals  Encounter Vitals Group     BP 05/09/23 0935 (!) 142/88     Systolic BP Percentile --      Diastolic BP Percentile --      Pulse Rate 05/09/23 0935 (!) 121     Resp 05/09/23 0935 16     Temp 05/09/23 0935 99.3 F (37.4 C)     Temp Source 05/09/23 0935 Oral     SpO2 05/09/23 0935 96 %     Weight --      Height --      Head Circumference --      Peak Flow --      Pain Score 05/09/23 0937 0     Pain Loc --      Pain Education --      Exclude from Growth Chart --    No data found.  Updated Vital Signs BP (!) 142/88 (BP Location: Left Arm)   Pulse (!) 121   Temp 99.3 F (37.4 C) (Oral)   Resp 16   LMP  04/27/2023 (Approximate)   SpO2 96%   Visual Acuity Right Eye Distance:   Left Eye Distance:   Bilateral Distance:    Right Eye Near:   Left Eye Near:    Bilateral Near:     Physical Exam Vitals and nursing note reviewed.  Constitutional:      Appearance: She is not ill-appearing or toxic-appearing.  HENT:     Head: Normocephalic and atraumatic.     Right Ear: Hearing, tympanic membrane, ear canal and external ear normal.     Left Ear: Hearing, tympanic membrane, ear canal and external ear normal.     Nose: Congestion present.     Mouth/Throat:     Lips: Pink.     Mouth: Mucous membranes are moist. No injury or oral lesions.     Dentition: Normal dentition.     Tongue: No lesions.     Pharynx: Oropharynx is clear. Uvula midline. Posterior oropharyngeal erythema present. No pharyngeal swelling, oropharyngeal exudate, uvula swelling or postnasal drip.     Tonsils: No tonsillar exudate.  Eyes:     General: Lids are normal. Vision grossly intact. Gaze aligned appropriately.     Extraocular Movements: Extraocular movements intact.     Conjunctiva/sclera: Conjunctivae normal.  Neck:     Trachea: Trachea and phonation normal.  Cardiovascular:     Rate and Rhythm: Normal rate and regular rhythm.     Heart sounds: Normal heart sounds, S1 normal and S2 normal.  Pulmonary:     Effort: Pulmonary effort is normal. No respiratory distress.     Breath sounds: Normal breath sounds and air entry. No wheezing, rhonchi or rales.     Comments: Lungs clear, no adventitious findings. Speaking in full sentences without difficulty or increased respiratory effort.  Chest:     Chest wall: No tenderness.  Musculoskeletal:     Cervical back: Neck supple.     Right lower leg: No edema.     Left lower leg: No edema.  Lymphadenopathy:     Cervical: No cervical adenopathy.  Skin:    General: Skin is warm and dry.     Capillary Refill: Capillary refill takes less than 2 seconds.     Findings: No  rash.  Neurological:     General: No focal deficit present.     Mental  Status: She is alert and oriented to person, place, and time. Mental status is at baseline.     Cranial Nerves: No dysarthria or facial asymmetry.  Psychiatric:        Mood and Affect: Mood normal.        Speech: Speech normal.        Behavior: Behavior normal.        Thought Content: Thought content normal.        Judgment: Judgment normal.      UC Treatments / Results  Labs (all labs ordered are listed, but only abnormal results are displayed) Labs Reviewed  SARS CORONAVIRUS 2 (TAT 6-24 HRS)  POC COVID19/FLU A&B COMBO    EKG   Radiology DG Chest 2 View Result Date: 05/09/2023 CLINICAL DATA:  Cough, fever, shortness of breath EXAM: CHEST - 2 VIEW COMPARISON:  Chest radiograph 04/27/2023 FINDINGS: The heart size and mediastinal contours are within normal limits. Both lungs are clear. The visualized skeletal structures are unremarkable. IMPRESSION: No active cardiopulmonary disease. Electronically Signed   By: Annia Belt M.D.   On: 05/09/2023 10:27    Procedures Procedures (including critical care time)  Medications Ordered in UC Medications  ondansetron (ZOFRAN-ODT) disintegrating tablet 4 mg (4 mg Oral Not Given 05/09/23 1019)  acetaminophen (TYLENOL) tablet 975 mg (975 mg Oral Given 05/09/23 1018)    Initial Impression / Assessment and Plan / UC Course  I have reviewed the triage vital signs and the nursing notes.  Pertinent labs & imaging results that were available during my care of the patient were reviewed by me and considered in my medical decision making (see chart for details).   1. Viral URI with cough, shortness of breath Suspect viral URI, viral syndrome.  Strep/viral testing: POC COVID and flu testing negative. PCR COVID testing pending, staff will call if positive and send in antiviral.   Chest x-ray unremarkable for signs of acute cardiopulmonary process. Suspect this is a new viral  process versus continuation of previous illness.   Physical exam findings reassuring, vital signs hemodynamically stable, and lungs clear. Advised supportive care/prescriptions for symptomatic relief as outlined in AVS.    Counseled patient on potential for adverse effects with medications prescribed/recommended today, strict ER and return-to-clinic precautions discussed, patient verbalized understanding.    Final Clinical Impressions(s) / UC Diagnoses   Final diagnoses:  Shortness of breath  Viral URI with cough     Discharge Instructions      You have a viral illness which will improve on its own with rest, fluids, and medications to help with your symptoms.  Tylenol, guaifenesin (plain mucinex), and saline nasal sprays may help relieve symptoms.   Albuterol inhaler every 4-6 hours as needed for cough, shortness of breath, and wheezing.   Two teaspoons of honey in 1 cup of warm water every 4-6 hours may help with throat pains.  Humidifier in room at nighttime may help soothe cough (clean well daily).   For chest pain, shortness of breath, inability to keep food or fluids down without vomiting, fever that does not respond to tylenol or motrin, or any other severe symptoms, please go to the ER for further evaluation. Return to urgent care as needed, otherwise follow-up with PCP.       ED Prescriptions     Medication Sig Dispense Auth. Provider   promethazine-dextromethorphan (PROMETHAZINE-DM) 6.25-15 MG/5ML syrup Take 5 mLs by mouth at bedtime as needed for cough. 118 mL Carlisle Beers, FNP  benzonatate (TESSALON) 100 MG capsule Take 1 capsule (100 mg total) by mouth every 8 (eight) hours. 21 capsule Reita May M, FNP   Guaifenesin 1200 MG TB12 Take 1 tablet (1,200 mg total) by mouth in the morning and at bedtime. 14 tablet Carlisle Beers, FNP      PDMP not reviewed this encounter.   Carlisle Beers, [place] 05/09/23 1054

## 2023-05-09 NOTE — ED Triage Notes (Signed)
 Patient presents to the office for cough, congestion and body aches. Patient was seen on 04/27/2023 with similar symptoms.

## 2023-05-10 LAB — SARS CORONAVIRUS 2 (TAT 6-24 HRS): SARS Coronavirus 2: NEGATIVE

## 2023-05-23 ENCOUNTER — Other Ambulatory Visit: Payer: Self-pay

## 2023-05-23 ENCOUNTER — Encounter: Payer: Self-pay | Admitting: Internal Medicine

## 2023-05-23 ENCOUNTER — Ambulatory Visit (INDEPENDENT_AMBULATORY_CARE_PROVIDER_SITE_OTHER): Payer: Self-pay | Admitting: Internal Medicine

## 2023-05-23 VITALS — BP 110/78 | HR 76 | Temp 99.1°F | Ht 60.24 in | Wt 159.9 lb

## 2023-05-23 DIAGNOSIS — J310 Chronic rhinitis: Secondary | ICD-10-CM

## 2023-05-23 DIAGNOSIS — R053 Chronic cough: Secondary | ICD-10-CM

## 2023-05-23 MED ORDER — ALBUTEROL SULFATE HFA 108 (90 BASE) MCG/ACT IN AERS: 2.0000 | INHALATION_SPRAY | Freq: Four times a day (QID) | RESPIRATORY_TRACT | 2 refills | Status: DC | PRN

## 2023-05-23 MED ORDER — IPRATROPIUM BROMIDE 0.03 % NA SOLN: 2.0000 | Freq: Three times a day (TID) | NASAL | 5 refills | Status: DC | PRN

## 2023-05-23 NOTE — Patient Instructions (Addendum)
 Chronic Rhinitis: suspected allergic - suspect allergic to tree and weed pollen/possibly outdoor mold based on history, consider allergy testing pending cost - Prevention:  - allergen avoidance when possible - Symptom control: - Start Nasal Steroid Spray: Best results if used daily. - Options include Flonase (fluticasone), Nasocort (triamcinolone), Nasonex (mometasome) 1- 2 sprays in each nostril daily.  - All can be purchased over-the-counter if not covered by insurance. - Start Astelin (azelastine) 1-2 sprays in each nostril twice a day as needed for nasal congestion/itchy nose-purchase over the counter - Start Atrovent (Ipratropium Bromide) 1-2 sprays in each nostril up to 3 times a day as needed for runny nose/post nasal drip/drainage.   - Use less frequently if airway gets too dry. - Start Antihistamine: daily or daily as needed.   -Options include Zyrtec (Cetirizine) 10mg , Claritin (Loratadine) 10mg , Allegra (Fexofenadine) 180mg , or Xyzal (Levocetirinze) 5mg  - Can be purchased over-the-counter if not covered by insurance.  Allergic Conjunctivitis:  - Start Allergy Eye drops-great options include Pataday (Olopatadine) or Zaditor (ketotifen) for eye symptoms daily as needed-both sold over the counter if not covered by insurance.  -Avoid eye drops that say red eye relief as they may contain medications that dry out your eyes.  Post-Infectious Cough Persistent cough and fatigue post-pneumonia. Increased sensitivity to cold air. Lung examination normal. Suspected post-infectious cough. - Advise inhaler use albuterol 2 puffs every 4 hours as needed for wheezing, shortness of breath, or severe coughing. - Instruct to track inhaler usage to assess need for a different inhaler.  Follow up : - Schedule follow-up in 4-6 weeks to reassess condition and inhaler usage. It was a pleasure meeting you in clinic today! Thank you for allowing me to participate in your care.  Tonny Bollman, MD [facility]  Reducing Pollen Exposure  The American Academy of Allergy, Asthma and Immunology suggests the following steps to reduce your exposure to pollen during allergy seasons.    Do not hang sheets or clothing out to dry; pollen may collect on these items. Do not mow lawns or spend time around freshly cut grass; mowing stirs up pollen. Keep windows closed at night.  Keep car windows closed while driving. Minimize morning activities outdoors, a time when pollen counts are usually at their highest. Stay indoors as much as possible when pollen counts or humidity is high and on windy days when pollen tends to remain in the air longer. Use air conditioning when possible.  Many air conditioners have filters that trap the pollen spores. Use a HEPA room air filter to remove pollen form the indoor air you breathe.

## 2023-05-23 NOTE — Progress Notes (Signed)
 NEW PATIENT Date of Service/Encounter:  05/23/23 Referring provider: none-self referred Primary care provider: Patient, No Pcp Per  Subjective:  Alisha Hall is a 43 y.o. female presenting today for evaluation of allergies and asthma. History obtained from: chart review and patient and spouse .    History of Present Illness   Alisha Hall is a 43 year old female who presents with recent exacerbation of allergy symptoms and a history of sinusitis. She is accompanied by her husband.  She experiences seasonal allergy symptoms, including itchy eyes and nasal congestion, typically triggered by pollen. These symptoms occur twice a year, during spring and fall, and have been present for 16 years since the birth of her third child. She wears a mask outdoors to reduce exposure and takes Zyrtec and a nasal spray for relief, although symptoms recur with pollen exposure. No history of asthma, eczema, atopic dermatitis, or allergies to food, medications, or insects.  She has not undergone allergy testing. Her symptoms have been managed with over-the-counter medications. In March, she experienced a respiratory illness initially thought to be sinusitis, later diagnosed as early pneumonia via chest X-ray. This was accompanied by fever, significant fatigue, and a persistent cough. She continued working despite the illness and now reports increased fatigue and sensitivity to cold air, which exacerbates her cough and causes lung discomfort. She uses an inhaler as needed, prescribed during her illness, and notes feeling less energetic than before. Her inhaler is albuterol. She reports that her cough has since improved. She still has the albuterol and was told to use it only as needed at her last visit. She has not been using it often. She has no prior history of asthma.  Her vaccines are up-to-date.      Chart Review:   05/09/23: normal CXR 04/27/23: normal CXR  Past Medical  History: Past Medical History:  Diagnosis Date   Medical history non-contributory    Medication List:  Current Outpatient Medications  Medication Sig Dispense Refill   albuterol (VENTOLIN HFA) 108 (90 Base) MCG/ACT inhaler Inhale 1-2 puffs into the lungs every 6 (six) hours as needed for wheezing or shortness of breath. 6.7 g 0   albuterol (VENTOLIN HFA) 108 (90 Base) MCG/ACT inhaler Inhale 2 puffs into the lungs every 6 (six) hours as needed for wheezing or shortness of breath. 8 g 2   cetirizine (ZYRTEC ALLERGY) 10 MG tablet Take 1 tablet (10 mg total) by mouth at bedtime. 30 tablet 2   ipratropium (ATROVENT) 0.03 % nasal spray Place 2 sprays into both nostrils 3 (three) times daily as needed for rhinitis. 30 mL 5   No current facility-administered medications for this visit.   Known Allergies:  No Known Allergies Past Surgical History: Past Surgical History:  Procedure Laterality Date   NO PAST SURGERIES     Family History: Family History  Problem Relation Age of Onset   Cancer Maternal Grandfather    Diabetes Mother    Social History: [place] lives in a house built 20 years ago, no water damage, laminate wood floors, electric heating, central AC, 1 indoor cat, 1 outdoor dog, not using dust mite covers on the bed or the pillows, no smoke exposure.  Works in [employer].  No HEPA filter in the home.  [place]   ROS:  All other systems negative except as noted per HPI.  Objective:  Blood pressure 110/78, pulse 76, temperature 99.1 F (37.3 C), temperature source Temporal, height 5' 0.24" (1.53  m), weight 159 lb 14.4 oz (72.5 kg), last menstrual period 04/27/2023, SpO2 98%. Body mass index is 30.98 kg/m. Physical Exam:  General Appearance:  Alert, cooperative, no distress, appears stated age  Head:  Normocephalic, without obvious abnormality, atraumatic  Eyes:  Conjunctiva clear, EOM's intact  Ears EACs normal bilaterally and normal TMs bilaterally   Nose: Nares normal, hypertrophic turbinates, normal mucosa, and no visible anterior polyps  Throat: Lips, tongue normal; teeth and gums normal, normal posterior oropharynx  Neck: Supple, symmetrical  Lungs:   clear to auscultation bilaterally, Respirations unlabored, no coughing  Heart:  regular rate and rhythm and no murmur, Appears well perfused  Extremities: No edema  Skin: Skin color, texture, turgor normal and no rashes or lesions on visualized portions of skin  Neurologic: No gross deficits   Diagnostics: Spirometry:  Tracings reviewed. Her effort: Good reproducible efforts. FVC: 2.43L FEV1: 2.12L, 77% predicted FEV1/FVC ratio: 0.87 ) Interpretation: Nonobstructive ratio, low FEV1, possible restriction.  Please see scanned spirometry results for details.  Labs:  Lab Orders  No laboratory test(s) ordered today     Assessment and Plan  Chronic Rhinitis: suspected allergic - suspect allergic to tree and weed pollen/possibly outdoor mold based on history, consider allergy testing pending cost (self-pay) - Prevention:  - allergen avoidance when possible-see pollen avoidance below - Symptom control: - Start Nasal Steroid Spray: Best results if used daily. - Options include Flonase (fluticasone), Nasocort (triamcinolone), Nasonex (mometasome) 1- 2 sprays in each nostril daily.  - All can be purchased over-the-counter if not covered by insurance. - Start Astelin (azelastine) 1-2 sprays in each nostril twice a day as needed for nasal congestion/itchy nose-purchase over the counter - Start Atrovent (Ipratropium Bromide) 1-2 sprays in each nostril up to 3 times a day as needed for runny nose/post nasal drip/drainage.   - Use less frequently if airway gets too dry. - Start Antihistamine: daily or daily as needed.   -Options include Zyrtec (Cetirizine) 10mg , Claritin (Loratadine) 10mg , Allegra (Fexofenadine) 180mg , or Xyzal (Levocetirinze) 5mg  - Can be purchased over-the-counter if  not covered by insurance.  Allergic Conjunctivitis:  - Start Allergy Eye drops-great options include Pataday (Olopatadine) or Zaditor (ketotifen) for eye symptoms daily as needed-both sold over the counter if not covered by insurance.  -Avoid eye drops that say red eye relief as they may contain medications that dry out your eyes.  Post-Infectious Cough Persistent cough and fatigue post-pneumonia. Increased sensitivity to cold air. Lung examination normal. Suspected post-infectious cough. - Advise inhaler use albuterol 2 puffs every 4 hours as needed for wheezing, shortness of breath, or severe coughing. - Instruct to track inhaler usage to assess need for a different inhaler.  Follow up : - Schedule follow-up in 4-6 weeks to reassess condition and inhaler usage. It was a pleasure meeting you in clinic today! Thank you for allowing me to participate in your care.  This note in its entirety was forwarded to the Provider who requested this consultation.  Other: samples provided of: pataday and allegra  Thank you for your kind referral. I appreciate the opportunity to take part in Alisha Hall's care. Please do not hesitate to contact me with questions.  Sincerely,  Tonny Bollman, MD [facility]

## 2023-07-04 ENCOUNTER — Encounter: Payer: Self-pay | Admitting: Internal Medicine

## 2023-07-04 ENCOUNTER — Ambulatory Visit (INDEPENDENT_AMBULATORY_CARE_PROVIDER_SITE_OTHER): Payer: Self-pay | Admitting: Internal Medicine

## 2023-07-04 ENCOUNTER — Other Ambulatory Visit: Payer: Self-pay

## 2023-07-04 VITALS — BP 118/76 | HR 83 | Temp 98.4°F | Resp 18 | Ht 63.0 in | Wt 162.5 lb

## 2023-07-04 DIAGNOSIS — J310 Chronic rhinitis: Secondary | ICD-10-CM

## 2023-07-04 DIAGNOSIS — R053 Chronic cough: Secondary | ICD-10-CM

## 2023-07-04 MED ORDER — IPRATROPIUM BROMIDE 0.03 % NA SOLN: 2.0000 | Freq: Three times a day (TID) | NASAL | 5 refills | Status: DC | PRN

## 2023-07-04 MED ORDER — AZELASTINE HCL 0.1 % NA SOLN: 2.0000 | Freq: Two times a day (BID) | NASAL | 5 refills | Status: DC | PRN

## 2023-07-04 NOTE — Progress Notes (Signed)
 FOLLOW UP Date of Service/Encounter:  07/04/23  Subjective:  Alisha Hall (DOB: 1980/06/03) is a 43 y.o. female who [facility] in re-evaluation of the following: suspected allergic rhinitis and conjunctivitis and cough History obtained from: chart review and patient.  For Review, LV was on 05/23/23  with Dr.Kiaria Quinnell seen for intial visit for suspected allergic rhiniits, chronic cough. See below for summary of history and diagnostics.  ----------------------------------------------------- Pertinent History/Diagnostics:  Chronic cough: post infectious. Persistent cough and fatigue post-pneumonia. Increased sensitivity to cold air. Lung examination normal. Suspected post-infectious cough.  -  Nonobstructive ratio, low FEV1, possible restriction.  spirometry (05/23/23): ratio 0.87, 77% FEV1  Chronic Rhinitis:  seasonal allergy symptoms, including itchy eyes and nasal congestion, typically triggered by pollen. These symptoms occur twice a year, during spring and fall, and have been present for 16 years since the birth of her third child. She wears a mask outdoors to reduce exposure and takes Zyrtec  and a nasal spray for relief, although symptoms recur with pollen exposure. No history of asthma, eczema, atopic dermatitis, or allergies to food, medications, or insects.  She is self-pay, medical management preferred  due to costs.  --------------------------------------------------- Today presents for follow-up. Discussed the use of AI scribe software for clinical note transcription with the patient, who gave verbal consent to proceed.  History of Present Illness   Alisha Hall is a 43 year old female who presents with persistent allergy symptoms.  She experiences significant itching in her eyes, frequent sneezing, and occasional throat itching. Symptoms are exacerbated by exposure to certain pollens, which she can sometimes  identify by smell. Despite current treatment, her symptoms persist.  She is currently using flonase , a nasal spray, and eye drops for her symptoms. She believes the nasal spray is fluticasone . She has not used Astepro  or Atrovent . Her cough has resolved.  Allegra caused insomnia, allowing her only one to two hours of sleep per night. She also experienced headaches with Zyrtec  and Claritin, which also caused drowsiness. She has not tried Xyzal yet.  She is concerned about the cost of allergy testing and treatment, as she does not have insurance and finds it expensive.       All medications reviewed by clinical staff and updated in chart. No new pertinent medical or surgical history except as noted in HPI.  ROS: All others negative except as noted per HPI.   Objective:  Pulse 83   Temp 98.4 F (36.9 C) (Oral)   Resp 18   Ht 5\' 3"  (1.6 m)   Wt 162 lb 8 oz (73.7 kg)   SpO2 97%   BMI 28.79 kg/m  Body mass index is 28.79 kg/m. Physical Exam: General Appearance:  Alert, cooperative, no distress, appears stated age  Head:  Normocephalic, without obvious abnormality, atraumatic  Eyes:  Conjunctiva clear, EOM's intact  Ears EACs normal bilaterally and normal TMs bilaterally  Nose: Nares normal, hypertrophic turbinates, normal mucosa, and no visible anterior polyps  Throat: Lips, tongue normal; teeth and gums normal, normal posterior oropharynx  Neck: Supple, symmetrical  Lungs:   clear to auscultation bilaterally, Respirations unlabored, no coughing  Heart:  regular rate and rhythm and no murmur, Appears well perfused  Extremities: No edema  Skin: Skin color, texture, turgor normal and no rashes or lesions on visualized portions of skin  Neurologic: No gross deficits   Labs:  Lab Orders  No laboratory test(s) ordered today    Assessment/Plan  Chronic Rhinitis: suspected allergic-not at goal - suspect allergic to tree and weed pollen/possibly outdoor mold based on history,  consider allergy testing pending cost - Prevention:  - allergen avoidance when possible - Symptom control: - Continue Nasal Steroid Spray: Best results if used daily. - Options include Flonase  (fluticasone ), Nasocort (triamcinolone), Nasonex (mometasome) 1- 2 sprays in each nostril daily.  - All can be purchased over-the-counter if not covered by insurance. - Continue Astelin  (azelastine )/Astepro  1-2 sprays in each nostril twice a day as needed for nasal congestion/itchy nose-purchase over the counter - Continue Atrovent  (Ipratropium Bromide ) 1-2 sprays in each nostril up to 3 times a day as needed for runny nose/post nasal drip/drainage.   - Use less frequently if airway gets too dry. - Continue Antihistamine: daily or daily as needed.   -Options include Zyrtec  (Cetirizine ) 10mg , Claritin (Loratadine) 10mg , Allegra (Fexofenadine) 180mg , or Xyzal (Levocetirinze) 5mg  - Can be purchased over-the-counter if not covered by insurance.  Allergic Conjunctivitis: not at goal - Start Allergy Eye drops-great options include Pataday  (Olopatadine ) or Zaditor (ketotifen) for eye symptoms daily as needed-both sold over the counter if not covered by insurance.  -Avoid eye drops that say red eye relief as they may contain medications that dry out your eyes.  Post-Infectious Cough-resolved. Persistent cough and fatigue post-pneumonia. Increased sensitivity to cold air. Lung examination normal. Suspected post-infectious cough. - Advise inhaler use albuterol  2 puffs every 4 hours as needed for wheezing, shortness of breath, or severe coughing. - Instruct to track inhaler usage to assess need for a different inhaler.  Follow up : - Schedule follow-up in 12 months to reassess condition and inhaler usage. It was a pleasure seeing you again in clinic today! Thank you for allowing me to participate in your care.  Other: none  Jonathon Neighbors, MD  [facility] 

## 2023-07-04 NOTE — Patient Instructions (Addendum)
 Chronic Rhinitis: suspected allergic - suspect allergic to tree and weed pollen/possibly outdoor mold based on history, consider allergy testing pending cost - Prevention:  - allergen avoidance when possible - Symptom control: - Continue Nasal Steroid Spray: Best results if used daily. - Options include Flonase  (fluticasone ), Nasocort (triamcinolone), Nasonex (mometasome) 1- 2 sprays in each nostril daily.  - All can be purchased over-the-counter if not covered by insurance. - Continue Astelin  (azelastine )/Astepro  1-2 sprays in each nostril twice a day as needed for nasal congestion/itchy nose-purchase over the counter - Continue Atrovent  (Ipratropium Bromide ) 1-2 sprays in each nostril up to 3 times a day as needed for runny nose/post nasal drip/drainage.   - Use less frequently if airway gets too dry. - Continue Antihistamine: daily or daily as needed.   -Options include Zyrtec  (Cetirizine ) 10mg , Claritin (Loratadine) 10mg , Allegra (Fexofenadine) 180mg , or Xyzal (Levocetirinze) 5mg  - Can be purchased over-the-counter if not covered by insurance.  Allergic Conjunctivitis:  - Start Allergy Eye drops-great options include Pataday  (Olopatadine ) or Zaditor (ketotifen) for eye symptoms daily as needed-both sold over the counter if not covered by insurance.  -Avoid eye drops that say red eye relief as they may contain medications that dry out your eyes.  Post-Infectious Cough-resolved. Persistent cough and fatigue post-pneumonia. Increased sensitivity to cold air. Lung examination normal. Suspected post-infectious cough. - Advise inhaler use albuterol  2 puffs every 4 hours as needed for wheezing, shortness of breath, or severe coughing. - Instruct to track inhaler usage to assess need for a different inhaler.  Follow up : - Schedule follow-up in 12 months to reassess condition and inhaler usage. It was a pleasure seeing you again in clinic today! Thank you for allowing me to participate in your  care.  Alisha Neighbors, MD [facility]  Reducing Pollen Exposure  The American Academy of Allergy, Asthma and Immunology suggests the following steps to reduce your exposure to pollen during allergy seasons.    Do not hang sheets or clothing out to dry; pollen may collect on these items. Do not mow lawns or spend time around freshly cut grass; mowing stirs up pollen. Keep windows closed at night.  Keep car windows closed while driving. Minimize morning activities outdoors, a time when pollen counts are usually at their highest. Stay indoors as much as possible when pollen counts or humidity is high and on windy days when pollen tends to remain in the air longer. Use air conditioning when possible.  Many air conditioners have filters that trap the pollen spores. Use a HEPA room air filter to remove pollen form the indoor air you breathe.

## 2023-07-31 ENCOUNTER — Emergency Department (HOSPITAL_COMMUNITY): Payer: Self-pay

## 2023-07-31 ENCOUNTER — Emergency Department (HOSPITAL_COMMUNITY)
Admission: EM | Admit: 2023-07-31 | Discharge: 2023-07-31 | Disposition: A | Discharge status: Home / Self Care | Payer: Self-pay | Attending: Emergency Medicine | Admitting: Emergency Medicine

## 2023-07-31 ENCOUNTER — Other Ambulatory Visit: Payer: Self-pay

## 2023-07-31 ENCOUNTER — Encounter (HOSPITAL_COMMUNITY): Payer: Self-pay

## 2023-07-31 DIAGNOSIS — M542 Cervicalgia: Secondary | ICD-10-CM | POA: Insufficient documentation

## 2023-07-31 DIAGNOSIS — W19XXXA Unspecified fall, initial encounter: Secondary | ICD-10-CM

## 2023-07-31 DIAGNOSIS — M25512 Pain in left shoulder: Secondary | ICD-10-CM | POA: Insufficient documentation

## 2023-07-31 DIAGNOSIS — W01198A Fall on same level from slipping, tripping and stumbling with subsequent striking against other object, initial encounter: Secondary | ICD-10-CM | POA: Insufficient documentation

## 2023-07-31 DIAGNOSIS — R519 Headache, unspecified: Secondary | ICD-10-CM | POA: Insufficient documentation

## 2023-07-31 MED ORDER — OXYCODONE HCL 5 MG PO TABS
5.0000 mg | ORAL_TABLET | Freq: Once | ORAL | Status: AC
  Administered 2023-07-31: 5 mg via ORAL
  Filled 2023-07-31: qty 1

## 2023-07-31 MED ORDER — LIDOCAINE 5 % EX PTCH: 1.0000 | MEDICATED_PATCH | CUTANEOUS | 0 refills | Status: DC

## 2023-07-31 NOTE — Discharge Instructions (Addendum)
 Tiene un chichn en la cabeza por la cada.  Recomiendo hielo en la zona.  Tylenol  e ibuprofeno para el dolor y la inflamacin.  Parche de Lidoderm  en el cuello. Colquelo y djelo reposar durante 12 horas. Deje el parche sin aplicar durante 12 horas antes de colocar el parche adicional.   Tylenol  e ibuprofeno para el dolor y la inflamacin.  Tylenol  1000 mg cada 6 horas. No tome ms de 4000 mg en 24 horas.  Ibuprofeno 800 mg cada 8 horas. No tome ms de 2400 mg en 24 horas.

## 2023-07-31 NOTE — ED Triage Notes (Addendum)
 The patient's husband states she tripped going down the stairs today around 1445, fell down one step and hit the back of her head on the concrete wall. Denies LOC, no blood thinners. Denies neck pain but she reports left shoulder pain and headache. She is tearful in triage.

## 2023-07-31 NOTE — ED Provider Notes (Addendum)
 [place] EMERGENCY DEPARTMENT AT [facility] Provider Note   CSN: [id number] Arrival date & time: 07/31/23  1516    Patient presents with: Alisha Hall is a 43 y.o. female here for evaluation of fall.  Mechanical fall prior to arrival.  [place] down 1 step subsequently fell backwards hit the posterior aspect of her head and left shoulder.  She has pain to the back of her head, neck, shoulder.  She is able to range her shoulder.  No numbness or weakness.  No LOC or anticoagulation.  No blurred vision, emesis.  Was able to ambulate PTA.  Took 1000 mg of Tylenol  PTA.  Denies chance of pregnancy.  No chest pain, abdominal pain, thoracic or lumbar spine pain   HPI     Prior to Admission medications   Medication Sig Start Date End Date Taking? Authorizing Provider  lidocaine  (LIDODERM ) 5 % Place 1 patch onto the skin daily. Remove & Discard patch within 12 hours or as directed by MD 07/31/23  Yes Donzella Carrol A, PA-C  azelastine  (ASTELIN ) 0.1 % nasal spray Place 2 sprays into both nostrils 2 (two) times daily as needed for rhinitis. Use in each nostril as directed 07/04/23   Sean Czar, MD  cetirizine  (ZYRTEC  ALLERGY) 10 MG tablet Take 1 tablet (10 mg total) by mouth at bedtime. 02/03/22   Raspet, Erin K, PA-C  ipratropium (ATROVENT ) 0.03 % nasal spray Place 2 sprays into both nostrils 3 (three) times daily as needed for rhinitis. 07/04/23   Sean Czar, MD    Allergies: Patient has no known allergies.    Review of Systems  Constitutional: Negative.   HENT: Negative.    Respiratory: Negative.    Cardiovascular: Negative.   Gastrointestinal: Negative.   Genitourinary: Negative.   Musculoskeletal:  Positive for neck pain.       Neck pain, left shoulder pain  Skin: Negative.   Neurological:  Positive for headaches. Negative for dizziness, seizures, syncope, facial asymmetry, speech difficulty, weakness, light-headedness and numbness.  All other  systems reviewed and are negative.   Updated Vital Signs BP (!) 137/98 (BP Location: Right Arm)   Pulse 92   Temp 98.7 F (37.1 C)   Resp 16   Ht 5' 3 (1.6 m)   Wt 72.6 kg   LMP 07/24/2023 (Approximate)   SpO2 100%   BMI 28.34 kg/m   Physical Exam Vitals and nursing note reviewed.  Constitutional:      General: She is not in acute distress.    Appearance: She is well-developed. She is not ill-appearing or toxic-appearing.  HENT:     Head: Normocephalic and atraumatic.     Comments: Nontender facial bones, no facial swelling.  No drooling, dysphagia or trismus    Nose: Nose normal.   Eyes:     Extraocular Movements: Extraocular movements intact.     Pupils: Pupils are equal, round, and reactive to light.   Neck:     Trachea: Trachea and phonation normal.      Comments: Tenderness left paraspinal region, full range of motion. Cardiovascular:     Rate and Rhythm: Normal rate.     Pulses: Normal pulses.          Radial pulses are 2+ on the right side and 2+ on the left side.       Dorsalis pedis pulses are 2+ on the right side and 2+ on the left side.     Heart  sounds: Normal heart sounds.  Pulmonary:     Effort: Pulmonary effort is normal. No respiratory distress.     Breath sounds: Normal breath sounds and air entry.  Chest:     Comments: Nontender chest wall Abdominal:     General: Bowel sounds are normal. There is no distension.     Palpations: Abdomen is soft.     Tenderness: There is no abdominal tenderness.   Musculoskeletal:        General: Normal range of motion.     Cervical back: Full passive range of motion without pain and normal range of motion. No edema, erythema, rigidity, torticollis or crepitus. Muscular tenderness present. No pain with movement. Normal range of motion.     Comments: Diffuse tenderness left lateral shoulder.  Nontender left forearm, chest, proximal humerus.  Able to lift arm over shoulder without difficulty.  No midline T/L  tenderness.  Nontender bilateral lower extremities.  Nontender right upper extremity.  Compartments soft.   Skin:    General: Skin is warm and dry.     Capillary Refill: Capillary refill takes less than 2 seconds.     Comments: No contusions, abrasions, lacerations   Neurological:     General: No focal deficit present.     Mental Status: She is alert.     Cranial Nerves: Cranial nerves 2-12 are intact.     Sensory: Sensation is intact.     Motor: Motor function is intact.     Gait: Gait is intact.   Psychiatric:        Mood and Affect: Mood normal.     (all labs ordered are listed, but only abnormal results are displayed) Labs Reviewed - No data to display  EKG: None  Radiology: DG Shoulder Left Result Date: 07/31/2023 CLINICAL DATA:  fall EXAM: LEFT SHOULDER - 2+ VIEW COMPARISON:  April 27, 2023 FINDINGS: No acute fracture or dislocation. Joint spaces and alignment are maintained. No area of erosion or osseous destruction. No unexpected radiopaque foreign body. Soft tissues are unremarkable. IMPRESSION: No acute fracture or dislocation. Electronically Signed   By: Clancy Crimes M.D.   On: 07/31/2023 16:29   CT Head Wo Contrast Result Date: 07/31/2023 CLINICAL DATA:  Head and neck trauma EXAM: CT HEAD WITHOUT CONTRAST CT CERVICAL SPINE WITHOUT CONTRAST TECHNIQUE: Multidetector CT imaging of the head and cervical spine was performed following the standard protocol without intravenous contrast. Multiplanar CT image reconstructions of the cervical spine were also generated. RADIATION DOSE REDUCTION: This exam was performed according to the departmental dose-optimization program which includes automated exposure control, adjustment of the mA and/or kV according to patient size and/or use of iterative reconstruction technique. COMPARISON:  None Available. FINDINGS: CT HEAD FINDINGS Brain: No evidence of acute infarction, hemorrhage, hydrocephalus, extra-axial collection or mass  lesion/mass effect. Vascular: No hyperdense vessel or unexpected calcification. Skull: Normal. Negative for fracture or focal lesion. Sinuses/Orbits: No acute finding. Other: None. CT CERVICAL SPINE FINDINGS Alignment: Normal. Skull base and vertebrae: No acute fracture. No primary bone lesion or focal pathologic process. Soft tissues and spinal canal: No prevertebral fluid or swelling. No visible canal hematoma. Disc levels:  Intact. Upper chest: Negative. Other: None. IMPRESSION: 1. No acute intracranial pathology. 2. No fracture or static subluxation of the cervical spine. Electronically Signed   By: Fredricka Jenny M.D.   On: 07/31/2023 16:13   CT Cervical Spine Wo Contrast Result Date: 07/31/2023 CLINICAL DATA:  Head and neck trauma EXAM: CT HEAD WITHOUT CONTRAST  CT CERVICAL SPINE WITHOUT CONTRAST TECHNIQUE: Multidetector CT imaging of the head and cervical spine was performed following the standard protocol without intravenous contrast. Multiplanar CT image reconstructions of the cervical spine were also generated. RADIATION DOSE REDUCTION: This exam was performed according to the departmental dose-optimization program which includes automated exposure control, adjustment of the mA and/or kV according to patient size and/or use of iterative reconstruction technique. COMPARISON:  None Available. FINDINGS: CT HEAD FINDINGS Brain: No evidence of acute infarction, hemorrhage, hydrocephalus, extra-axial collection or mass lesion/mass effect. Vascular: No hyperdense vessel or unexpected calcification. Skull: Normal. Negative for fracture or focal lesion. Sinuses/Orbits: No acute finding. Other: None. CT CERVICAL SPINE FINDINGS Alignment: Normal. Skull base and vertebrae: No acute fracture. No primary bone lesion or focal pathologic process. Soft tissues and spinal canal: No prevertebral fluid or swelling. No visible canal hematoma. Disc levels:  Intact. Upper chest: Negative. Other: None. IMPRESSION: 1. No acute  intracranial pathology. 2. No fracture or static subluxation of the cervical spine. Electronically Signed   By: Fredricka Jenny M.D.   On: 07/31/2023 16:13     Procedures   Medications Ordered in the ED  oxyCODONE  (Oxy IR/ROXICODONE ) immediate release tablet 5 mg (5 mg Oral Given 07/31/23 5256)    43 year old here for mechanical fall.  She has a posterior headache, neck pain, left shoulder pain.  She is neurovascular intact.  She has full range of motion to all her extremities.  No LOC or anticoagulation.  No obvious traumatic injury on exam however she is tearful.  Will plan on pain management imaging and reassess  Imaging personally viewed and interpreted:  CT head wo acute abnormality CT cervical wo acute abnormality Left shoulder wo acute abnormality  Patient reassessed.  Discussed labs and imaging with patient, significant other in room.  Reassuring workup.  Will follow-up outpatient, return for new or worsening symptoms.  Low suspicion for bleed, occult fracture, dislocation, infectious process, ligamentous cervical injury, arterial or vascular injury.   The patient has been appropriately medically screened and/or stabilized in the ED. I have low suspicion for any other emergent medical condition which would require further screening, evaluation or treatment in the ED or require inpatient management.  Patient is hemodynamically stable and in no acute distress.  Patient able to ambulate in department prior to ED.  Evaluation does not show acute pathology that would require ongoing or additional emergent interventions while in the emergency department or further inpatient treatment.  I have discussed the diagnosis with the patient and answered all questions.  Pain is been managed while in the emergency department and patient has no further complaints prior to discharge.  Patient is comfortable with plan discussed in room and is stable for discharge at this time.  I have discussed strict  return precautions for returning to the emergency department.  Patient was encouraged to follow-up with PCP/specialist refer to at discharge.                                  Medical Decision Making Amount and/or Complexity of Data Reviewed Independent Historian: spouse External Data Reviewed: labs, radiology and notes. Radiology: ordered and independent interpretation performed. Decision-making details documented in ED Course.  Risk OTC drugs. Prescription drug management. Decision regarding hospitalization. Diagnosis or treatment significantly limited by social determinants of health.      Final diagnoses:  Fall, initial encounter    ED Discharge Orders  Ordered    lidocaine  (LIDODERM ) 5 %  Every 24 hours        07/31/23 1636                Ollie Delano A, PA-C 07/31/23 1636    Lowery Rue, DO 07/31/23 1855

## 2023-07-31 NOTE — ED Notes (Signed)
 Patient provided with discharge paperwork. Pt demonstrated understanding of material. All questions, comments, and concerns addressed. Pt ambulated in hallway towards exit with no complications

## 2023-10-19 ENCOUNTER — Other Ambulatory Visit: Payer: Self-pay | Admitting: Obstetrics & Gynecology

## 2023-10-19 DIAGNOSIS — Z1231 Encounter for screening mammogram for malignant neoplasm of breast: Secondary | ICD-10-CM

## 2023-11-01 ENCOUNTER — Ambulatory Visit
Admission: RE | Admit: 2023-11-01 | Discharge: 2023-11-01 | Disposition: A | Discharge status: Home / Self Care | Payer: Self-pay | Source: Ambulatory Visit | Attending: Obstetrics & Gynecology | Admitting: Obstetrics & Gynecology

## 2023-11-01 DIAGNOSIS — Z1231 Encounter for screening mammogram for malignant neoplasm of breast: Secondary | ICD-10-CM

## 2024-03-15 ENCOUNTER — Ambulatory Visit (INDEPENDENT_AMBULATORY_CARE_PROVIDER_SITE_OTHER)

## 2024-03-15 ENCOUNTER — Encounter (HOSPITAL_COMMUNITY): Payer: Self-pay | Admitting: *Deleted

## 2024-03-15 ENCOUNTER — Ambulatory Visit (HOSPITAL_COMMUNITY)
Admission: EM | Admit: 2024-03-15 | Discharge: 2024-03-15 | Disposition: A | Discharge status: Home / Self Care | Payer: Self-pay | Attending: Family Medicine | Admitting: Family Medicine

## 2024-03-15 ENCOUNTER — Other Ambulatory Visit: Payer: Self-pay

## 2024-03-15 DIAGNOSIS — R0789 Other chest pain: Secondary | ICD-10-CM

## 2024-03-15 MED ORDER — DEXAMETHASONE SOD PHOSPHATE PF 10 MG/ML IJ SOLN
INTRAMUSCULAR | Status: AC
  Filled 2024-03-15: qty 1

## 2024-03-15 MED ORDER — DEXAMETHASONE SOD PHOSPHATE PF 10 MG/ML IJ SOLN
10.0000 mg | Freq: Once | INTRAMUSCULAR | Status: AC
  Administered 2024-03-15: 10 mg via INTRAMUSCULAR

## 2024-03-15 MED ORDER — KETOROLAC TROMETHAMINE 60 MG/2ML IM SOLN
INTRAMUSCULAR | Status: AC
  Filled 2024-03-15: qty 2

## 2024-03-15 MED ORDER — KETOROLAC TROMETHAMINE 30 MG/ML IJ SOLN
INTRAMUSCULAR | Status: AC
  Filled 2024-03-15: qty 1

## 2024-03-15 MED ORDER — KETOROLAC TROMETHAMINE 60 MG/2ML IM SOLN
60.0000 mg | Freq: Once | INTRAMUSCULAR | Status: AC
  Administered 2024-03-15: 60 mg via INTRAMUSCULAR

## 2024-03-15 MED ORDER — METHOCARBAMOL 500 MG PO TABS: 500.0000 mg | ORAL_TABLET | Freq: Two times a day (BID) | ORAL | 0 refills | Status: AC

## 2024-03-15 NOTE — ED Triage Notes (Addendum)
 PT has had Rt arm and RT shoulder pain since Monday. Pt took Ibuprofen  with some relief of pain. 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 Pt reports pain when she moves her arm above her head and across chest. PT took Ibuprofen  last night. None today

## 2024-03-15 NOTE — Discharge Instructions (Signed)
 Meds ordered this encounter  Medications   ketorolac  (TORADOL ) injection 60 mg   dexamethasone  (DECADRON ) injection 10 mg   methocarbamol  (ROBAXIN ) 500 MG tablet    Sig: Take 1-2 tablets (500-1,000 mg total) by mouth 2 (two) times daily.    Dispense:  30 tablet    Refill:  0

## 2024-03-20 NOTE — ED Provider Notes (Signed)
 " [facility]   [id number] 03/15/24 Arrival Time: 1400  ASSESSMENT & PLAN:  1. Acute chest wall pain     Patient history and exam consistent with non-cardiac cause of chest pain. Reproducible  on exam. I have personally viewed and independently interpreted the imaging studies ordered this visit. CXR: no acute changes; no pneumothorax.  Meds ordered this encounter  Medications   ketorolac  (TORADOL ) injection 60 mg   dexamethasone  (DECADRON ) injection 10 mg   methocarbamol  (ROBAXIN ) 500 MG tablet    Sig: Take 1-2 tablets (500-1,000 mg total) by mouth 2 (two) times daily.    Dispense:  30 tablet    Refill:  0    Chest pain precautions given.  Follow-up Information     [facility] at [facility]   Specialty: Urgent Care Why: If worsening or failing to improve as anticipated. Contact information: [address] [place]  [phone]                Reviewed expectations re: course of current medical issues. Questions answered. Outlined signs and symptoms indicating need for more acute intervention. Patient verbalized understanding. After Visit Summary given.   SUBJECTIVE:  History from: patient. Alisha Hall is a 44 y.o. female who has had Rt arm and RT shoulder pain since Monday. Pt took Ibuprofen  with some relief of pain. 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 Pt reports pain when she moves her arm above her head and across chest. PT took Ibuprofen  last night. No tx today. Does feel like a pull when I breathe though. Husband is interpreting.   Tobacco Use History[1] Social History   Substance and Sexual Activity  Alcohol Use No   Alcohol/week: 0.0 standard drinks of alcohol     OBJECTIVE:  Vitals:   03/15/24 1507  BP: 133/83  Pulse: 71  Resp: 18  Temp: 97.7 F (36.5 C)  SpO2: 97%    General appearance: alert, oriented, no acute distress Eyes: PERRLA; EOMI;  conjunctivae normal HENT: normocephalic; atraumatic Neck: supple with FROM Lungs: without labored respirations; speaks full sentences without difficulty; CTAB Heart: regular rate and rhythm without murmer Chest Wall: with tenderness to palpation that reproduces pain she describes in history Abdomen: soft, non-tender; no guarding or rebound tenderness Extremities: without edema; without calf swelling or tenderness; symmetrical without gross deformities Skin: warm and dry; without rash or lesions Neuro: normal gait Psychological: alert and cooperative; normal mood and affect  Labs:  Labs Reviewed - No data to display  Imaging: No results found.   Allergies[2]  Past Medical History:  Diagnosis Date   Medical history non-contributory    Social History   Socioeconomic History   Marital status: Married    Spouse name: Not on file   Number of children: Not on file   Years of education: Not on file   Highest education level: Not on file  Occupational History   Not on file  Tobacco Use   Smoking status: Never    Passive exposure: Never   Smokeless tobacco: Never  Vaping Use   Vaping status: Never Used  Substance and Sexual Activity   Alcohol use: No    Alcohol/week: 0.0 standard drinks of alcohol   Drug use: No   Sexual activity: Yes    Birth control/protection: None  Other Topics Concern   Not on file  Social History Narrative   Not on file   Social Drivers of  Health   Tobacco Use: Low Risk (03/15/2024)   Patient History    Smoking Tobacco Use: Never    Smokeless Tobacco Use: Never    Passive Exposure: Never  Financial Resource Strain: Not on file  Food Insecurity: Not on file  Transportation Needs: Not on file  Physical Activity: Not on file  Stress: Not on file  Social Connections: Not on file  Intimate Partner Violence: Not on file  Depression (EYV7-0): Not on file  Alcohol Screen: Not on file  Housing: Not on file  Utilities: Not on file  Health  Literacy: Not on file   Family History  Problem Relation Age of Onset   Cancer Maternal Grandfather    Diabetes Mother    Past Surgical History:  Procedure Laterality Date   NO PAST SURGERIES         [1]  Social History Tobacco Use  Smoking Status Never   Passive exposure: Never  Smokeless Tobacco Never  [2] No Known Allergies    Rolinda Rogue, MD 03/20/24 0830  "
